# Patient Record
Sex: Female | Born: 1960 | Race: White | Hispanic: No | Marital: Married | Smoking: Never smoker
Health system: Southern US, Community
[De-identification: ages and names within clinical notes are randomized; demographics above are authoritative.]

## PROBLEM LIST (undated history)

## (undated) DIAGNOSIS — U071 COVID-19: Secondary | ICD-10-CM

## (undated) DIAGNOSIS — E559 Vitamin D deficiency, unspecified: Secondary | ICD-10-CM

## (undated) DIAGNOSIS — L309 Dermatitis, unspecified: Secondary | ICD-10-CM

## (undated) DIAGNOSIS — I671 Cerebral aneurysm, nonruptured: Secondary | ICD-10-CM

## (undated) DIAGNOSIS — H539 Unspecified visual disturbance: Secondary | ICD-10-CM

## (undated) DIAGNOSIS — E7800 Pure hypercholesterolemia, unspecified: Secondary | ICD-10-CM

## (undated) DIAGNOSIS — M5412 Radiculopathy, cervical region: Secondary | ICD-10-CM

## (undated) DIAGNOSIS — I2510 Atherosclerotic heart disease of native coronary artery without angina pectoris: Secondary | ICD-10-CM

## (undated) DIAGNOSIS — M62830 Muscle spasm of back: Secondary | ICD-10-CM

## (undated) DIAGNOSIS — Z8489 Family history of other specified conditions: Secondary | ICD-10-CM

## (undated) DIAGNOSIS — H9312 Tinnitus, left ear: Secondary | ICD-10-CM

## (undated) DIAGNOSIS — H4712 Papilledema associated with decreased ocular pressure: Secondary | ICD-10-CM

## (undated) DIAGNOSIS — M47812 Spondylosis without myelopathy or radiculopathy, cervical region: Secondary | ICD-10-CM

## (undated) DIAGNOSIS — D649 Anemia, unspecified: Secondary | ICD-10-CM

## (undated) DIAGNOSIS — M722 Plantar fascial fibromatosis: Secondary | ICD-10-CM

## (undated) DIAGNOSIS — R590 Localized enlarged lymph nodes: Secondary | ICD-10-CM

## (undated) DIAGNOSIS — F419 Anxiety disorder, unspecified: Secondary | ICD-10-CM

## (undated) DIAGNOSIS — H40059 Ocular hypertension, unspecified eye: Secondary | ICD-10-CM

## (undated) DIAGNOSIS — G529 Cranial nerve disorder, unspecified: Principal | ICD-10-CM

## (undated) DIAGNOSIS — D099 Carcinoma in situ, unspecified: Secondary | ICD-10-CM

## (undated) DIAGNOSIS — M8580 Other specified disorders of bone density and structure, unspecified site: Secondary | ICD-10-CM

## (undated) HISTORY — PX: BREAST BIOPSY: SHX20

## (undated) HISTORY — DX: Radiculopathy, cervical region: M54.12

## (undated) HISTORY — DX: Atherosclerotic heart disease of native coronary artery without angina pectoris: I25.10

## (undated) HISTORY — DX: Plantar fascial fibromatosis: M72.2

## (undated) HISTORY — DX: Localized enlarged lymph nodes: R59.0

## (undated) HISTORY — DX: Other specified disorders of bone density and structure, unspecified site: M85.80

## (undated) HISTORY — DX: Unspecified visual disturbance: H53.9

## (undated) HISTORY — DX: Cranial nerve disorder, unspecified: G52.9

## (undated) HISTORY — DX: Anxiety disorder, unspecified: F41.9

## (undated) HISTORY — DX: Cerebral aneurysm, nonruptured: I67.1

## (undated) HISTORY — DX: Pure hypercholesterolemia, unspecified: E78.00

## (undated) HISTORY — DX: Papilledema associated with decreased ocular pressure: H47.12

## (undated) HISTORY — DX: Vitamin D deficiency, unspecified: E55.9

## (undated) HISTORY — DX: Spondylosis without myelopathy or radiculopathy, cervical region: M47.812

## (undated) HISTORY — PX: BREAST SURGERY: SHX581

## (undated) HISTORY — DX: Tinnitus, left ear: H93.12

## (undated) HISTORY — DX: Muscle spasm of back: M62.830

## (undated) HISTORY — DX: COVID-19: U07.1

## (undated) HISTORY — DX: Carcinoma in situ, unspecified: D09.9

## (undated) HISTORY — DX: Ocular hypertension, unspecified eye: H40.059

---

## 1996-08-21 DIAGNOSIS — M62830 Muscle spasm of back: Secondary | ICD-10-CM

## 1996-08-21 HISTORY — DX: Muscle spasm of back: M62.830

## 2001-05-02 ENCOUNTER — Other Ambulatory Visit: Admission: RE | Admit: 2001-05-02 | Discharge: 2001-05-02 | Payer: Self-pay | Admitting: Gynecology

## 2002-03-14 ENCOUNTER — Encounter: Payer: Self-pay | Admitting: Gynecology

## 2002-03-14 ENCOUNTER — Encounter: Admission: RE | Admit: 2002-03-14 | Discharge: 2002-03-14 | Payer: Self-pay | Admitting: Gynecology

## 2002-05-05 ENCOUNTER — Other Ambulatory Visit: Admission: RE | Admit: 2002-05-05 | Discharge: 2002-05-05 | Payer: Self-pay | Admitting: Gynecology

## 2003-01-26 ENCOUNTER — Encounter: Payer: Self-pay | Admitting: Gynecology

## 2003-01-26 ENCOUNTER — Encounter: Admission: RE | Admit: 2003-01-26 | Discharge: 2003-01-26 | Payer: Self-pay | Admitting: Gynecology

## 2004-05-10 ENCOUNTER — Other Ambulatory Visit: Admission: RE | Admit: 2004-05-10 | Discharge: 2004-05-10 | Payer: Self-pay | Admitting: Gynecology

## 2004-07-25 ENCOUNTER — Encounter: Admission: RE | Admit: 2004-07-25 | Discharge: 2004-07-25 | Payer: Self-pay | Admitting: Radiology

## 2004-07-27 ENCOUNTER — Encounter: Admission: RE | Admit: 2004-07-27 | Discharge: 2004-07-27 | Payer: Self-pay | Admitting: Orthopedic Surgery

## 2004-10-13 ENCOUNTER — Encounter: Admission: RE | Admit: 2004-10-13 | Discharge: 2004-10-13 | Payer: Self-pay | Admitting: Internal Medicine

## 2004-10-26 ENCOUNTER — Encounter: Admission: RE | Admit: 2004-10-26 | Discharge: 2004-10-26 | Payer: Self-pay | Admitting: Orthopedic Surgery

## 2005-01-11 ENCOUNTER — Encounter: Admission: RE | Admit: 2005-01-11 | Discharge: 2005-01-11 | Payer: Self-pay | Admitting: Orthopedic Surgery

## 2005-06-08 ENCOUNTER — Other Ambulatory Visit: Admission: RE | Admit: 2005-06-08 | Discharge: 2005-06-08 | Payer: Self-pay | Admitting: Gynecology

## 2006-01-02 ENCOUNTER — Encounter: Admission: RE | Admit: 2006-01-02 | Discharge: 2006-01-02 | Payer: Self-pay | Admitting: Gynecology

## 2006-01-17 ENCOUNTER — Encounter: Admission: RE | Admit: 2006-01-17 | Discharge: 2006-01-17 | Payer: Self-pay | Admitting: Radiology

## 2006-06-26 ENCOUNTER — Other Ambulatory Visit: Admission: RE | Admit: 2006-06-26 | Discharge: 2006-06-26 | Payer: Self-pay | Admitting: Gynecology

## 2007-01-07 ENCOUNTER — Encounter: Admission: RE | Admit: 2007-01-07 | Discharge: 2007-01-07 | Payer: Self-pay | Admitting: Gynecology

## 2007-07-15 ENCOUNTER — Other Ambulatory Visit: Admission: RE | Admit: 2007-07-15 | Discharge: 2007-07-15 | Payer: Self-pay | Admitting: Gynecology

## 2008-01-08 ENCOUNTER — Encounter: Admission: RE | Admit: 2008-01-08 | Discharge: 2008-01-08 | Payer: Self-pay | Admitting: Gynecology

## 2009-01-08 ENCOUNTER — Encounter: Admission: RE | Admit: 2009-01-08 | Discharge: 2009-01-08 | Payer: Self-pay | Admitting: Gynecology

## 2010-01-25 ENCOUNTER — Encounter: Admission: RE | Admit: 2010-01-25 | Discharge: 2010-01-25 | Payer: Self-pay | Admitting: Gynecology

## 2010-04-17 ENCOUNTER — Ambulatory Visit (HOSPITAL_COMMUNITY): Admission: RE | Admit: 2010-04-17 | Discharge: 2010-04-17 | Payer: Self-pay | Admitting: Orthopedic Surgery

## 2010-04-21 HISTORY — PX: CERVICAL SPINE SURGERY: SHX589

## 2010-05-09 ENCOUNTER — Ambulatory Visit (HOSPITAL_COMMUNITY): Admission: RE | Admit: 2010-05-09 | Discharge: 2010-05-10 | Payer: Self-pay | Admitting: Neurosurgery

## 2010-11-03 LAB — CBC
HCT: 36.6 % (ref 36.0–46.0)
Hemoglobin: 12.8 g/dL (ref 12.0–15.0)
MCH: 31.8 pg (ref 26.0–34.0)
MCHC: 35 g/dL (ref 30.0–36.0)
MCV: 91 fL (ref 78.0–100.0)
Platelets: 331 10*3/uL (ref 150–400)
RBC: 4.02 MIL/uL (ref 3.87–5.11)
RDW: 12.2 % (ref 11.5–15.5)
WBC: 8.3 10*3/uL (ref 4.0–10.5)

## 2010-11-03 LAB — SURGICAL PCR SCREEN
MRSA, PCR: NEGATIVE
Staphylococcus aureus: POSITIVE — AB

## 2010-12-20 ENCOUNTER — Other Ambulatory Visit: Payer: Self-pay | Admitting: Gynecology

## 2010-12-20 DIAGNOSIS — Z1231 Encounter for screening mammogram for malignant neoplasm of breast: Secondary | ICD-10-CM

## 2011-01-27 ENCOUNTER — Ambulatory Visit
Admission: RE | Admit: 2011-01-27 | Discharge: 2011-01-27 | Disposition: A | Discharge status: Home / Self Care | Payer: 59 | Source: Ambulatory Visit | Attending: Gynecology | Admitting: Gynecology

## 2011-01-27 DIAGNOSIS — Z1231 Encounter for screening mammogram for malignant neoplasm of breast: Secondary | ICD-10-CM

## 2011-12-28 ENCOUNTER — Other Ambulatory Visit: Payer: Self-pay | Admitting: Gynecology

## 2011-12-28 DIAGNOSIS — M8580 Other specified disorders of bone density and structure, unspecified site: Secondary | ICD-10-CM

## 2011-12-28 DIAGNOSIS — Z1231 Encounter for screening mammogram for malignant neoplasm of breast: Secondary | ICD-10-CM

## 2012-02-06 ENCOUNTER — Ambulatory Visit
Admission: RE | Admit: 2012-02-06 | Discharge: 2012-02-06 | Disposition: A | Discharge status: Home / Self Care | Payer: 59 | Source: Ambulatory Visit | Attending: Gynecology | Admitting: Gynecology

## 2012-02-06 DIAGNOSIS — Z1231 Encounter for screening mammogram for malignant neoplasm of breast: Secondary | ICD-10-CM

## 2012-02-06 DIAGNOSIS — M8580 Other specified disorders of bone density and structure, unspecified site: Secondary | ICD-10-CM

## 2012-06-19 ENCOUNTER — Other Ambulatory Visit: Payer: Self-pay | Admitting: Neurosurgery

## 2012-06-19 DIAGNOSIS — M47812 Spondylosis without myelopathy or radiculopathy, cervical region: Secondary | ICD-10-CM

## 2012-06-19 DIAGNOSIS — M5030 Other cervical disc degeneration, unspecified cervical region: Secondary | ICD-10-CM

## 2012-06-19 DIAGNOSIS — M5020 Other cervical disc displacement, unspecified cervical region: Secondary | ICD-10-CM

## 2012-06-24 ENCOUNTER — Ambulatory Visit
Admission: RE | Admit: 2012-06-24 | Discharge: 2012-06-24 | Disposition: A | Discharge status: Home / Self Care | Payer: 59 | Source: Ambulatory Visit | Attending: Neurosurgery | Admitting: Neurosurgery

## 2012-06-24 DIAGNOSIS — M5030 Other cervical disc degeneration, unspecified cervical region: Secondary | ICD-10-CM

## 2012-06-24 DIAGNOSIS — M47812 Spondylosis without myelopathy or radiculopathy, cervical region: Secondary | ICD-10-CM

## 2012-06-24 DIAGNOSIS — M5020 Other cervical disc displacement, unspecified cervical region: Secondary | ICD-10-CM

## 2012-09-11 ENCOUNTER — Other Ambulatory Visit: Payer: Self-pay | Admitting: Radiology

## 2012-09-11 LAB — TSH: TSH: 1.624 u[IU]/mL (ref 0.350–4.500)

## 2012-09-12 ENCOUNTER — Ambulatory Visit
Admission: RE | Admit: 2012-09-12 | Discharge: 2012-09-12 | Disposition: A | Discharge status: Home / Self Care | Payer: 59 | Source: Ambulatory Visit | Attending: Ophthalmology | Admitting: Ophthalmology

## 2012-09-12 ENCOUNTER — Other Ambulatory Visit: Payer: Self-pay | Admitting: Ophthalmology

## 2012-09-12 DIAGNOSIS — H532 Diplopia: Secondary | ICD-10-CM

## 2012-09-12 DIAGNOSIS — H538 Other visual disturbances: Secondary | ICD-10-CM

## 2012-09-12 MED ORDER — GADOBENATE DIMEGLUMINE 529 MG/ML IV SOLN
14.0000 mL | Freq: Once | INTRAVENOUS | Status: AC | PRN
  Administered 2012-09-12: 14 mL via INTRAVENOUS

## 2012-09-21 HISTORY — PX: INTRACRANIAL ANEURYSM REPAIR: SHX1839

## 2012-10-15 ENCOUNTER — Other Ambulatory Visit: Payer: Self-pay | Admitting: *Deleted

## 2012-10-15 ENCOUNTER — Ambulatory Visit
Admission: RE | Admit: 2012-10-15 | Discharge: 2012-10-15 | Disposition: A | Discharge status: Home / Self Care | Payer: 59 | Source: Ambulatory Visit | Attending: *Deleted | Admitting: *Deleted

## 2012-10-15 ENCOUNTER — Other Ambulatory Visit: Payer: Self-pay | Admitting: Ophthalmology

## 2012-10-15 DIAGNOSIS — H538 Other visual disturbances: Secondary | ICD-10-CM

## 2012-10-15 DIAGNOSIS — R51 Headache: Secondary | ICD-10-CM

## 2012-11-24 ENCOUNTER — Ambulatory Visit (HOSPITAL_COMMUNITY)
Admission: RE | Admit: 2012-11-24 | Discharge: 2012-11-24 | Disposition: A | Discharge status: Home / Self Care | Payer: 59 | Source: Ambulatory Visit | Attending: Ophthalmology | Admitting: Ophthalmology

## 2012-11-24 ENCOUNTER — Other Ambulatory Visit (HOSPITAL_COMMUNITY): Payer: Self-pay | Admitting: Ophthalmology

## 2012-11-24 DIAGNOSIS — Z48812 Encounter for surgical aftercare following surgery on the circulatory system: Secondary | ICD-10-CM | POA: Insufficient documentation

## 2012-11-24 DIAGNOSIS — G453 Amaurosis fugax: Secondary | ICD-10-CM

## 2012-11-24 DIAGNOSIS — H3400 Transient retinal artery occlusion, unspecified eye: Secondary | ICD-10-CM | POA: Insufficient documentation

## 2012-11-24 DIAGNOSIS — H53129 Transient visual loss, unspecified eye: Secondary | ICD-10-CM | POA: Insufficient documentation

## 2012-11-24 DIAGNOSIS — I671 Cerebral aneurysm, nonruptured: Secondary | ICD-10-CM | POA: Insufficient documentation

## 2012-11-25 ENCOUNTER — Ambulatory Visit
Admission: RE | Admit: 2012-11-25 | Discharge: 2012-11-25 | Disposition: A | Discharge status: Home / Self Care | Payer: 59 | Source: Ambulatory Visit | Attending: Diagnostic Radiology | Admitting: Diagnostic Radiology

## 2012-11-25 ENCOUNTER — Other Ambulatory Visit (HOSPITAL_COMMUNITY): Payer: Self-pay | Admitting: Diagnostic Radiology

## 2012-11-25 ENCOUNTER — Other Ambulatory Visit: Payer: Self-pay | Admitting: Ophthalmology

## 2012-11-25 DIAGNOSIS — R519 Headache, unspecified: Secondary | ICD-10-CM

## 2012-11-28 ENCOUNTER — Other Ambulatory Visit (HOSPITAL_COMMUNITY): Payer: Self-pay | Admitting: *Deleted

## 2012-11-28 ENCOUNTER — Other Ambulatory Visit (HOSPITAL_COMMUNITY): Payer: Self-pay | Admitting: Ophthalmology

## 2012-11-28 DIAGNOSIS — G453 Amaurosis fugax: Secondary | ICD-10-CM

## 2012-11-29 ENCOUNTER — Other Ambulatory Visit (HOSPITAL_COMMUNITY): Payer: Self-pay | Admitting: Radiology

## 2012-11-29 DIAGNOSIS — G453 Amaurosis fugax: Secondary | ICD-10-CM

## 2012-12-17 ENCOUNTER — Ambulatory Visit
Admission: RE | Admit: 2012-12-17 | Discharge: 2012-12-17 | Disposition: A | Discharge status: Home / Self Care | Payer: 59 | Source: Ambulatory Visit | Attending: Ophthalmology | Admitting: Ophthalmology

## 2012-12-17 ENCOUNTER — Other Ambulatory Visit: Payer: Self-pay | Admitting: Ophthalmology

## 2012-12-17 DIAGNOSIS — J9859 Other diseases of mediastinum, not elsewhere classified: Secondary | ICD-10-CM

## 2012-12-31 ENCOUNTER — Other Ambulatory Visit: Payer: Self-pay

## 2012-12-31 DIAGNOSIS — Z1231 Encounter for screening mammogram for malignant neoplasm of breast: Secondary | ICD-10-CM

## 2013-02-06 ENCOUNTER — Ambulatory Visit
Admission: RE | Admit: 2013-02-06 | Discharge: 2013-02-06 | Disposition: A | Discharge status: Home / Self Care | Payer: 59 | Source: Ambulatory Visit

## 2013-02-06 DIAGNOSIS — Z1231 Encounter for screening mammogram for malignant neoplasm of breast: Secondary | ICD-10-CM

## 2013-03-05 ENCOUNTER — Telehealth: Payer: Self-pay | Admitting: *Deleted

## 2013-03-05 NOTE — Telephone Encounter (Signed)
Please review my thursday  Next week in AM - need a 11.00 o clock.

## 2013-03-05 NOTE — Telephone Encounter (Signed)
Dr. Kevan Ny called and asking about pt being seen sooner.  Wife of Dr. Antionette Poles.  Ocular neuromyotonia.  Appt 05-29-13.

## 2013-03-17 ENCOUNTER — Encounter: Payer: Self-pay | Admitting: Neurology

## 2013-03-18 ENCOUNTER — Ambulatory Visit (INDEPENDENT_AMBULATORY_CARE_PROVIDER_SITE_OTHER): Payer: 59 | Admitting: Neurology

## 2013-03-18 ENCOUNTER — Encounter: Payer: Self-pay | Admitting: Neurology

## 2013-03-18 VITALS — BP 163/100 | HR 100 | Resp 16 | Ht 66.0 in | Wt 158.0 lb

## 2013-03-18 DIAGNOSIS — G527 Disorders of multiple cranial nerves: Secondary | ICD-10-CM

## 2013-03-18 DIAGNOSIS — H4900 Third [oculomotor] nerve palsy, unspecified eye: Secondary | ICD-10-CM

## 2013-03-18 DIAGNOSIS — I671 Cerebral aneurysm, nonruptured: Secondary | ICD-10-CM | POA: Insufficient documentation

## 2013-03-18 DIAGNOSIS — G529 Cranial nerve disorder, unspecified: Secondary | ICD-10-CM

## 2013-03-18 DIAGNOSIS — R253 Fasciculation: Secondary | ICD-10-CM

## 2013-03-18 DIAGNOSIS — H4902 Third [oculomotor] nerve palsy, left eye: Secondary | ICD-10-CM

## 2013-03-18 DIAGNOSIS — H5702 Anisocoria: Secondary | ICD-10-CM

## 2013-03-18 DIAGNOSIS — R259 Unspecified abnormal involuntary movements: Secondary | ICD-10-CM

## 2013-03-18 DIAGNOSIS — H532 Diplopia: Secondary | ICD-10-CM | POA: Insufficient documentation

## 2013-03-18 MED ORDER — PYRIDOSTIGMINE BROMIDE 60 MG PO TABS: 60.0000 mg | ORAL_TABLET | Freq: Two times a day (BID) | ORAL | Status: DC

## 2013-03-18 NOTE — Progress Notes (Signed)
[facility]  Provider:  Dr Eyonna Sandstrom Referring Provider: Marden Noble, MD Primary Care Physician:  Pearla Dubonnet, MD  Chief Complaint  Patient presents with  . New Evaluation    [name], ocular neuromyotonia w/ superior oblique myokymia, paper referral, rm 10    HPI:  Melanie Aguirre is Aguirre 52 y.o. female here as Aguirre referral from Dr. Kevan Ny for Aguirre new look at Aguirre complicated patient.   Melanie Aguirre  menopausal ,right handed, married female , mother of 3, presents with Aguirre confusing History of oculomotor and pupillary changes to the left eye.  The patient reports that on 09/02/2012 shortly after she left her ophthalmologist's office she experienced for the first time diplopia this changes were worse when she looked to the right and up but resolved without further intervention this and probably two minutes or less. She sees Dr. Dagoberto Ligas for Aguirre mild elevated intraocular pressure and Aguirre thickened cornea.  Her spell remained not associated with tinnitus, numbness, dysesthesia, vertigo, nausea, balance problems or headaches. She has been in good health over all of the one episode of hypotension that did not require treatment at the time and has Aguirre history of cervical disc disease and anterior fusion in September 2011 by Dr Newell Coral. 3 days later following this time Aguirre dental procedure she noticed that her face became numb and she again experienced him minutes or less of vision blurriness another 3 days later she broke up with Aguirre very slight headache which is unusual for her. She noted some twitching of her right lower eyelid that day she had probably 5 or 6 episodes that she has characterized as vision blurriness from there on it happened every day with increasing frequency and made an hour or 250 times daily. She called her husband and she realized that the lion's of the bookshelves she was looking out the separating vertically that is when she realized that when she closed her left eye  she could see just well and if she) right her vision was abnormal. She also noticed the deviation of images was worse when looking up into the right. On January 20 as she was seen by Dr. Renella Cunas at here in [place] who did not find any neurologic focal deficits at the time. Dr. Dagoberto Ligas referred her to Dr. Daphine Deutscher at [facility] neuro ophthalmology department. Dr. Daphine Deutscher noticed Aguirre right hypertropia episodic and normalizing this in less than Aguirre minute. Visual to it it was unaffected she was seen again on 10-15-12 and during the exam had one of her spells but was not witnessed. Dr. 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 Differential diagnosis included myokymia of the above-named nerve, neuromyotonia or ocular myasthenia. There was no ptosis as is associated and no peripheral weakness MRI studies of the brain were normal ;she had Aguirre negative acetylcholine receptor antibody test. Aguirre single fiber EMG have to be aborted, as the patient had been using cosmetic Botox to her forehead. The spells also did not correlate with the degree of fatigue or fatigability. Dr. 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 For 12 hours she felt  none of her spells but then they came back. She continued to experience episodic double vision with interictal  intermittent normal vision. 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 In April she had Aguirre spell of loss of vision in the left eye only Aguirre friend of hers witnessed the spell  and described that her left eye tuned in and  Down, so far  that she would only see the white of the eye.  Doing Dr. Earl Gala evaluation in [place] the patient was described as having Aguirre slight pupillary asymmetry, which I am again appreciating today at baseline her right eye has Aguirre pupillary size for the suboptimal millimeter smaller than the left pupil she has Aguirre moderate tip was the him as Aguirre courier remains been exposed to light there is no ptosis the brief monocular vision loss seen Dr. be related to Aguirre TIA or carotid disease and studies of the carotid arteries were free of concerning occlusion or narrowing. It also appears that her left eye is slightly wider opened than the right but there is no en- or exophthalmos.  The patient has been increasingly anxious and nervous, understandable after experiencing sore far unexplained spells. She reports mild muscle spasms or fasciculations although her right arm and Aguirre DeBakey on she reports that she feels slowed in her movements. Somehow she has tingling mostly right arm right leg and she has now had brief episodes of vertigo. She feels that she has an increased level of anxiety and feels stressed. Her blood pressure and heart rate has accelerated. She has been placed on aspirin and Plavix after the monocular vision loss report assuming that it was in Aguirre morals as for the tach. This has caused her to have indigestion and bloating.  The patient's request is to evaluate her overall neurologic status again and to see if any systemic disease could have affected her vision and [place] paroxysmal diplopia. For her I could not find documentation that she has had any serological tests, her MRI is given nor evidence of strokes not even small vessel disease, there is no evidence of multiple sclerosis, bleed or trauma at. Her high heart rate today and her elevated blood pressure may now have been present for about 6 weeks while the patient has progressed through her   ordeal. The pupil is at baseline  slightly dilated and we have no evidence that  anisocoria was present before Jan  2014,  consider an involvement of the third nerve or sympathetic nerve fibers  still possible.         Review of Systems: Out of Aguirre complete 14 system review, the patient complains of only the following symptoms, and all other reviewed systems are negative.  various.   History   Social History  . Marital Status: Married    Spouse Name: N/Aguirre    Number of Children: 3  . Years of Education: BA3   Occupational History  . not employed    Social History Main Topics  . Smoking status: Never Smoker   . Smokeless tobacco: Not on file  . Alcohol Use: No  . Drug Use: No  . Sexually Active: Not on file   Other Topics Concern  . Not on file   Social History Narrative  . No narrative on file    Family History  Problem Relation Age of Onset  . Hypertension Father   . Congestive Heart  Failure Maternal Grandfather   . Cancer Paternal Grandmother     breast   . Cancer Paternal Grandfather     lung, throat    Past Medical History  Diagnosis Date  . Papilledema associated with decreased ocular pressure   . DJD (degenerative joint disease), cervical     history of fusion  . 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 Back spasm 1998    episodic severe low back, felt to have probably from lthe L5-S1 area. Usually responds to oxycodone one or two doses.  . Anxiety     last 4 months   . Intracerebral aneurysm     Past Surgical History  Procedure Laterality Date  . Cervical spine surgery  04/2010    Dr Lauralyn Primes  . Intracranial aneurysm repair  09/2012    coiled-Dr Sharen Heck    Current Outpatient Prescriptions  Medication Sig Dispense Refill  . aspirin 81 MG tablet Take 81 mg by mouth daily.      .  fish oil-omega-3 fatty acids 1000 MG capsule Take 2 g by mouth daily.      . magnesium oxide (MAG-OX) 400 MG tablet Take 400 mg by mouth daily.      . Multiple Vitamin (MULTIVITAMIN) tablet Take 1 tablet by mouth daily.      Marland Kitchen omeprazole (PRILOSEC) 40 MG capsule Take 40 mg by mouth daily. Delayed release,as needed      . VITAMIN D, CHOLECALCIFEROL, PO Take 2,000 Units by mouth daily.      Marland Kitchen ketorolac (TORADOL) 10 MG tablet       . omega-3 acid ethyl esters (LOVAZA) 1 G capsule        No current facility-administered medications for this visit.    Allergies as of 03/18/2013 - Review Complete 03/18/2013  Allergen Reaction Noted  . Erythromycin  03/17/2013    Vitals: BP 163/100  Pulse 100  Resp 16  Ht 5\' 6"  (1.676 m)  Wt 158 lb (71.668 kg)  BMI 25.51 kg/m2 Last Weight:  Wt Readings from Last 1 Encounters:  03/18/13 158 lb (71.668 kg)   Last Height:   Ht Readings from Last 1 Encounters:  03/18/13 5\' 6"  (1.676 m)   Vision Screening:    Physical exam:  General: The patient is awake, alert and appears not in acute distress. The patient is well groomed. Head: Normocephalic, atraumatic. Neck is supple.  Anterior fusion scar is well healed , on theleft . Mallampati3, neck circumference: 14 inches , no retrognathia . Cardiovascular:  Regular rate and rhythm- with 100 bpm , without  murmurs or carotid bruit, and without distended neck veins. BP elevated.  Respiratory: Lungs are clear to auscultation. Skin:  Without evidence of edema, or rash Trunk: BMI is mildly  elevated and patient  has normal posture.  Neurologic exam : The patient is awake and alert, oriented to place and time.  Memory subjective  described as intact. There is Aguirre normal attention span & concentration ability. Speech is fluent withoutdysarthria, dysphonia or aphasia. Mood and affect are appropriate, al little anxious.  Cranial nerves: Pupils are inequal , the left is 2 mm wider, the left eye  appears wider open   -Both Pupils are briskly reactive to light. Funduscopic exam without evidence of pallor or edema. Extraocular movements  in vertical and horizontal planes show Aguirre  slight bobbing in the left ,  when looking to the right and up.   Visual fields by finger perimetry are intact. The patient is able to maintain an upward gaze as well as Aguirre downward gaze without any sign of fatigability.   Doing our 1 hour meeting was able to witness four spells of  the left eye turning down and inwards  -  Resulting in diplopia with gaze to the right and up.  Hearing to finger rub intact.  Facial sensation intact to fine touch. Facial motor strength is symmetric and tongue and uvula move midline. There is no visible flushing, puffiness of the face, neck or jaw line. No reported numbness , and no pain.   Motor exam:   Normal tone, bilateral grip strength  and normal muscle bulk and symmetric normal strength in all extremities. There is no rigidity or cogwheeling  Noticed. Applying Aguirre gel to the right arm and directing Aguirre slight onto it did not reveal any visible fasciculations, neither were there any palpable fasciculations.  Sensory:  Fine touch, pinprick and vibration were tested in all extremities. Proprioception is tested in the upper extremities only. This was  normal.  Coordination: Rapid alternating movements in the fingers/hands is tested and normal. Finger-to-nose maneuver tested and normal without evidence of ataxia, dysmetria or tremor.  Gait and station: Patient walks without assistive device and is able to  climb up to the exam table. Strength within normal limits. The patient is able to rise from Aguirre seated position without bracing herself. It was evident that with the change in posture and other of her " eye  spells " begun. The patient had stable gait, and negative Romberg maneuver and postural stability. She was able to suppress the diplopia and I seen no risks for her to fall based on what I witnessed today.    Tandem gait is unfragmented. The patient can turn with 3 steps- Romberg testing is normal.  Deep tendon reflexes: in the  upper and lower extremities are symmetric and intact. Babinski maneuver response is  downgoing.   Assessment:  After physical and neurologic examination, review of laboratory studies, imaging, neurophysiology testing and pre-existing records, assessment will be reviewed on the problem list.  I appreciate the patient's trust in me but I am surely tapping in the dark.  I read that the patient was supposed to try carbamazepine to see as the paroxysmal spells had Aguirre seizure origin. I also noticed reviewing her an upward laboratory and blood work that she is at baseline hypernatremic. Her chloride was also low. I suggested to try Mestinon twice Aguirre day to be taken in the morning and around lunchtime and to correlate her observations with any possible defect. This is Aguirre medication trial but that expects or result in less than 4 days on Mestinon, but Aguirre results in response to Tegretol may take weeks. I have further ask one of my neuromuscular specialist colleagues to perform an EMG nerve conduction study, based upon the patient's report of fasciculations that her husband has witnessed.  I will add Aguirre basic serology panel for autoimmune processes to her blood work.  I have contacted Dr. Johnella Moloney office today to discuss the possibility of Aguirre 24-hour urine should her high blood pressure and high heart rate remained sustained. But consider anxiety as Aguirre possible cause I would also like to rule out elevated nor epinephrine levels  In the future.  I explained to the patient that her 3 MRI studies  this year have not shown any brain disease, and today's neurologic exam was normal except for the periodic eye movements spells. I do not suspect Aguirre degenerative, demyelinating or paraneoplastic disorder. She has brought in not to have strokes, tumors, bleeds.     Serology for Aguirre autoimmune and  inflammatory changes.   EMG /NCs for reported faciculation.  All records form outside.. imageing .

## 2013-03-18 NOTE — Patient Instructions (Addendum)
  24 hours urine collection  - evaluate for  Adrenergic output. Phaeo. Please discuss with Dr Kevan Ny. EMG and NCS are ordered  for fasciculation evaluation.   Take mestinon 60 mg  In AM and one at lunch and observe.   Driving restrictions do not apply.

## 2013-03-19 NOTE — Addendum Note (Signed)
Addended by: [name] on: 03/19/2013 11:03 AM   Modules accepted: Orders

## 2013-03-20 ENCOUNTER — Other Ambulatory Visit: Payer: Self-pay | Admitting: Neurology

## 2013-03-24 ENCOUNTER — Encounter (INDEPENDENT_AMBULATORY_CARE_PROVIDER_SITE_OTHER): Payer: 59

## 2013-03-24 ENCOUNTER — Ambulatory Visit (INDEPENDENT_AMBULATORY_CARE_PROVIDER_SITE_OTHER): Payer: 59 | Admitting: Diagnostic Neuroimaging

## 2013-03-24 DIAGNOSIS — H532 Diplopia: Secondary | ICD-10-CM

## 2013-03-24 DIAGNOSIS — R253 Fasciculation: Secondary | ICD-10-CM

## 2013-03-24 DIAGNOSIS — R259 Unspecified abnormal involuntary movements: Secondary | ICD-10-CM

## 2013-03-24 DIAGNOSIS — Z0289 Encounter for other administrative examinations: Secondary | ICD-10-CM

## 2013-03-24 DIAGNOSIS — H4902 Third [oculomotor] nerve palsy, left eye: Secondary | ICD-10-CM

## 2013-03-24 DIAGNOSIS — H5702 Anisocoria: Secondary | ICD-10-CM

## 2013-03-24 NOTE — Procedures (Signed)
   [facility]  NCS (NERVE CONDUCTION STUDY) WITH EMG (ELECTROMYOGRAPHY) REPORT   STUDY DATE: 03/24/13 PATIENT NAME: Melanie Aguirre DOB: 02-26-1961 MRN: [id number]  ORDERING CLINICIAN: [name], MD   TECHNOLOGIST: Gearldine Shown ELECTROMYOGRAPHER: Glenford Bayley. Draden Cottingham, MD  CLINICAL INFORMATION: 52 year old female with muscle twitching. Exam shows normal strength. No visible fasciculations in the tongue or extremities.  FINDINGS: NERVE CONDUCTION STUDY: Right median, right ulnar, right peroneal and right tibial motor responses have normal distal latencies, amplitudes, conduction velocities, and normal right median and right ulnar F wave latencies. Right median, right ulnar, right superficial peroneal sensory responses are normal.  Repetitive nerve stimulation of right median nerve demonstrates stable CMAP amplitudes at baseline, and following exertion at 15 seconds, 30 seconds, 1 minute, 2 minutes, 3 minutes and 4 minutes.  NEEDLE ELECTROMYOGRAPHY: Needle examination of selected muscles of the right upper extremity (deltoid, biceps, triceps, flexor carpi radialis, first dorsal interosseous) demonstrates rare complex repetitive discharges and positive sharp waves in the right biceps muscle at rest and normal motor unit recruitment on exertion and all muscles tested. Right C5-6 and C7-T1 paraspinal muscles are normal.  IMPRESSION:  Equivocal study demonstrating rare spontaneous activity in the right biceps muscle, may reflect underlying mild right cervical radiculopathy (C5, C6). Nerve conduction studies, electromyography of cervical paraspinal muscles and repetitive nerve stimulation studies are normal. No electrodiagnostic evidence to suggest an underlying neuromuscular junction disorder, neuropathy or myopathy at this time.   INTERPRETING PHYSICIAN:  Suanne Marker, MD Certified in Neurology, Neurophysiology and Neuroimaging  [facility] [address] [place], [place] [address] [phone]

## 2013-03-26 LAB — ANA W/REFLEX: Anti Nuclear Antibody(ANA): NEGATIVE

## 2013-03-26 LAB — IFE AND PE, SERUM
Albumin SerPl Elph-Mcnc: 4.2 g/dL (ref 3.2–5.6)
Alpha 1: 0.3 g/dL (ref 0.1–0.4)
Alpha2 Glob SerPl Elph-Mcnc: 0.6 g/dL (ref 0.4–1.2)
IgM (Immunoglobulin M), Srm: 104 mg/dL (ref 40–230)
Total Protein: 7.2 g/dL (ref 6.0–8.5)

## 2013-03-26 LAB — LYME, TOTAL AB TEST/REFLEX: Lyme Ab: <0.91 index (ref 0.00–0.90)

## 2013-03-26 LAB — SJOGREN'S SYNDROME ANTIBODS(SSA + SSB)
ENA SSA (RO) Ab: <0.2 AI (ref 0.0–0.9)
ENA SSB (LA) Ab: <0.2 AI (ref 0.0–0.9)

## 2013-04-04 ENCOUNTER — Telehealth: Payer: Self-pay | Admitting: Neurology

## 2013-04-08 NOTE — Telephone Encounter (Signed)
I called pt and LMVM for her to return call about appt, but also about 24 hour urine.

## 2013-04-11 ENCOUNTER — Telehealth: Payer: Self-pay | Admitting: Neurology

## 2013-04-14 NOTE — Telephone Encounter (Signed)
I LMVM for pt that if still needed to call me back.

## 2013-04-24 ENCOUNTER — Other Ambulatory Visit: Payer: Self-pay | Admitting: Neurology

## 2013-04-24 DIAGNOSIS — H4902 Third [oculomotor] nerve palsy, left eye: Secondary | ICD-10-CM

## 2013-04-24 DIAGNOSIS — H532 Diplopia: Secondary | ICD-10-CM

## 2013-04-24 DIAGNOSIS — G529 Cranial nerve disorder, unspecified: Secondary | ICD-10-CM

## 2013-04-25 NOTE — Telephone Encounter (Signed)
I spoke to patient, who just returned from being [place] of the [place], and relayed that Dr. Vickey Huger wanted her to have a catecholamine- 24hr urine test with her PCP.  She said Dr. Kevan Ny just needed an order to perform test.  The order for the test was faxed on 04-24-13, and the patient will call when testing will be done and then we can set up a follow up with Dr. Vickey Huger.

## 2013-04-29 ENCOUNTER — Ambulatory Visit: Payer: 59 | Admitting: Neurology

## 2013-04-30 NOTE — Telephone Encounter (Signed)
Spoke to patient 24hr urine test will be turned in on 05-05-13, follow up appointment with Dr. Vickey Huger on 05-09-13.

## 2013-05-09 ENCOUNTER — Ambulatory Visit (INDEPENDENT_AMBULATORY_CARE_PROVIDER_SITE_OTHER): Payer: 59 | Admitting: Neurology

## 2013-05-09 ENCOUNTER — Encounter: Payer: Self-pay | Admitting: Neurology

## 2013-05-09 ENCOUNTER — Telehealth: Payer: Self-pay | Admitting: Neurology

## 2013-05-09 VITALS — BP 142/90 | HR 71 | Ht 66.0 in | Wt 157.0 lb

## 2013-05-09 DIAGNOSIS — S0412XD Injury of oculomotor nerve, left side, subsequent encounter: Secondary | ICD-10-CM

## 2013-05-09 DIAGNOSIS — Z5189 Encounter for other specified aftercare: Secondary | ICD-10-CM

## 2013-05-09 DIAGNOSIS — G529 Cranial nerve disorder, unspecified: Secondary | ICD-10-CM

## 2013-05-09 DIAGNOSIS — H5702 Anisocoria: Secondary | ICD-10-CM

## 2013-05-09 DIAGNOSIS — H532 Diplopia: Secondary | ICD-10-CM

## 2013-05-09 DIAGNOSIS — H4900 Third [oculomotor] nerve palsy, unspecified eye: Secondary | ICD-10-CM

## 2013-05-09 DIAGNOSIS — R259 Unspecified abnormal involuntary movements: Secondary | ICD-10-CM

## 2013-05-09 MED ORDER — CARBAMAZEPINE ER 100 MG PO TB12: 100.0000 mg | ORAL_TABLET | Freq: Two times a day (BID) | ORAL | Status: DC

## 2013-05-09 NOTE — Patient Instructions (Signed)
Hyponatremia  Hyponatremia is when the salt (sodium) in your blood is low. When salt becomes low, your cells take in extra water and puff up (swell). The puffiness can happen in the whole body. It mostly affects the brain and is very serious.  HOME CARE  Only take medicine as told by your doctor.  Follow any diet instructions you were given. This includes limiting how much fluid you drink.  Keep all doctor visits for tests as told.  Avoid alcohol and drugs. GET HELP RIGHT AWAY IF:  You start to twitch and shake (seize).  You pass out (faint).  You continue to have watery poop (diarrhea) or you throw up (vomit).  You feel sick to your stomach (nauseous).  You are tired (fatigued), have a headache, are confused, or feel weak.  Your problems that first brought you to the doctor come back.  You have trouble following your diet instructions. MAKE SURE YOU:   Understand these instructions.  Will watch your condition.  Will get help right away if you are not doing well or get worse. Document Released: 04/19/2011 Document Revised: 10/30/2011 Document Reviewed: 04/19/2011 [employer] Patient Information 2014 [employer].  

## 2013-05-09 NOTE — Progress Notes (Signed)
Chief Complaint  Patient presents with  . 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  The patient reports that on 09/02/2012 shortly after she left her ophthalmologist's office she experienced for the first time diplopia this changes were worse when she looked to the right and up but resolved without further intervention this and probably two minutes or less. She sees Dr. Dagoberto Ligas for a mild elevated intraocular pressure and a thickened cornea.  Her spell remained not associated with tinnitus, numbness, dysesthesia, vertigo, nausea, balance problems or headaches. She has been in good health over all of the one episode of hypotension that did not require treatment at the time and has a history of cervical disc disease and anterior fusion in September 2011 by Dr Newell Coral. 3 days later following this time a dental procedure she noticed that her face became numb and she again experienced him minutes or less of vision blurriness another 3 days later she broke up with a very slight headache which is unusual for her. She noted some twitching of her right lower eyelid that day she had probably 5 or 6 episodes that she has characterized as vision blurriness from there on it happened every day with increasing frequency and made an hour or 250 times daily. She called her husband and she realized that the lion's of the bookshelves she was looking out the separating vertically that is when she realized that when she closed her left eye she could see just well and if she) right her vision was abnormal.  She also noticed the deviation of images was greater  when looking up to the right. On January 20 as she was seen by Dr. Renella Cunas at here in [place] who did not find any neurologic focal deficits at the time. Dr. Dagoberto Ligas referred her to Dr.  Daphine Deutscher at [facility] neuro ophthalmology department.  Dr. Daphine Deutscher noticed a right hypertropia episodic and normalizing this in less than a minute. Visual to it it was unaffected she was seen again on 10-15-12 and during the exam had one of her spells but was not witnessed. Dr. 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  Differential diagnosis included myochymia of the above-named nerve, neuromyotonia or ocular myasthenia.  There was no ptosis as is associated , and no peripheral weakness.  MRI studies of the brain were normal ;she had a negative acetylcholine receptor antibody test. A single fiber EMG had to be aborted, as the patient had been using cosmetic Botox to her forehead.  The spells also did not correlate with the degree of fatigue or fatigability.  Dr. 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  For 12 hours she felt none of her spells but then they came back. She continued to experience episodic double vision with interictal intermittent normal vision.  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   In April she had a spell of loss of vision in the left eye only a friend of hers witnessed the spell-  and described that her left eye tuned in and downward, so far that she would only see the white of the eye.  Doing the evaluation in [place] , the patient was described as having a slight pupillary asymmetry, which I am again appreciating today-  At baseline her right eye  has a pupillary size millimeter smaller than the left pupil.  She has a moderate  remains been exposed to light,  there is no ptosis. The brief monocular vision loss seen by my colleagues can not be related to a TIA or carotid disease- and studies of the carotid arteries were free of concerning occlusion or narrowing.  It also appears that her left eye is slightly wider opened than the right but there is no en- or exophthalmos.  The patient has been increasingly anxious and nervous, understandable after experiencing sore far unexplained spells. She reports mild muscle spasms or fasciculations although her right arm and a DeBakey on she reports that she feels slowed in her movements. Somehow she has tingling mostly right arm right leg and she has now had brief episodes of vertigo. She feels that she has an increased level of anxiety and feels stressed. Her blood pressure and heart rate has accelerated. She has been placed on aspirin and Plavix after the monocular vision loss report assuming that it was in a morals as for the tach. This has caused her to have indigestion and bloating.  The patient's request is to evaluate her overall neurologic status again and to see if any systemic disease could have affected her vision and Kossa paroxysmal diplopia. For her I could not find documentation that she has had any serological tests, her MRI is given nor evidence of strokes not even small vessel disease, there is no evidence of multiple sclerosis, bleed or trauma at. Her high heart rate today and her elevated blood pressure may now have been present for about 6 weeks while the patient has progressed through her ordeal.   The pupil is at baseline slightly dilated and we have no evidence that anisocoria was present before Jan 2014, consider an involvement of the third nerve or sympathetic nerve fibers still possible.  [place] , used malaria prophylaxis. No problems.     Dx unclear:   Review of last exam- unchanged. Natrium levels slightly lower- labs per  Dr Kevan Ny,  24 hour urine , no catecholamine increase.   Plan treat as repetitive motor abnormalities ,  with Tegretol.  Initiate 200 mg bid po.

## 2013-05-12 ENCOUNTER — Telehealth: Payer: Self-pay | Admitting: Neurology

## 2013-05-12 ENCOUNTER — Other Ambulatory Visit: Payer: 59

## 2013-05-13 ENCOUNTER — Ambulatory Visit: Payer: Self-pay | Admitting: Neurology

## 2013-05-20 ENCOUNTER — Encounter: Payer: Self-pay | Admitting: Neurology

## 2013-05-21 ENCOUNTER — Telehealth: Payer: Self-pay

## 2013-05-21 NOTE — Telephone Encounter (Signed)
Message copied by [facility] on Wed May 21, 2013  4:01 PM ------      Message from: Vickey Huger, CARMEN      Created: Wed May 21, 2013  1:26 PM        Labs  to be drawn when patient has been on tegretol for at least 3 weeks, needs RV with me - offer a lunchtime appointment , please.       ----- Message -----         From: SYSTEM         Sent: 05/14/2013  12:08 AM           To: [name], MD                   ------

## 2013-05-21 NOTE — Telephone Encounter (Signed)
I called patient and let her know, Dr. Vickey Huger wanted patient to f/u with lab draw after being on tegretol for at least 4 weeks. Please call to set that up for next week.

## 2013-05-29 ENCOUNTER — Ambulatory Visit: Payer: 59 | Admitting: Neurology

## 2013-05-29 NOTE — Telephone Encounter (Signed)
Patient scheduled  For upcoming appt.

## 2013-05-29 NOTE — Telephone Encounter (Signed)
done

## 2013-05-30 ENCOUNTER — Telehealth: Payer: Self-pay | Admitting: Neurology

## 2013-06-06 NOTE — Telephone Encounter (Signed)
Patient is starting her tegretol on 10/23 and has sched a f/u appt on 11/06 (14 day f/u),was so late starting because she could not get an appointment.

## 2013-06-10 NOTE — Telephone Encounter (Signed)
Called patient to reschedule appt, no answer,lt VM for return call to scheuled

## 2013-06-26 ENCOUNTER — Ambulatory Visit: Payer: Self-pay | Admitting: Neurology

## 2013-06-26 ENCOUNTER — Other Ambulatory Visit: Payer: Self-pay

## 2013-07-09 ENCOUNTER — Encounter: Payer: Self-pay | Admitting: Neurology

## 2013-07-09 ENCOUNTER — Ambulatory Visit (INDEPENDENT_AMBULATORY_CARE_PROVIDER_SITE_OTHER): Payer: 59 | Admitting: Neurology

## 2013-07-09 VITALS — BP 157/89 | HR 79 | Resp 17 | Ht 66.0 in | Wt 154.0 lb

## 2013-07-09 DIAGNOSIS — S0412XD Injury of oculomotor nerve, left side, subsequent encounter: Secondary | ICD-10-CM

## 2013-07-09 DIAGNOSIS — G529 Cranial nerve disorder, unspecified: Secondary | ICD-10-CM

## 2013-07-09 HISTORY — DX: Cranial nerve disorder, unspecified: G52.9

## 2013-07-09 MED ORDER — CARBAMAZEPINE ER 200 MG PO TB12: 200.0000 mg | ORAL_TABLET | Freq: Two times a day (BID) | ORAL | Status: DC

## 2013-07-09 NOTE — Progress Notes (Signed)
Chief Complaint  Patient presents with  . 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  The patient reports that on 09/02/2012 shortly after she left her ophthalmologist's office she experienced for the first time diplopia. These  changes were worse when she looked to the right and up,  but resolved without further intervention.  Lasting  probably two minutes or less.  She sees Dr. Dagoberto Ligas for a mild elevated intraocular pressure and a thickened cornea.  Her spell remained not associated with tinnitus, numbness, dysesthesia, vertigo, nausea, balance problems or headaches. She has been in good health over all of the one episode of hypotension that did not require treatment at the time and has a history of cervical disc disease and anterior fusion in September 2011 by Dr Newell Coral. 3 days later following this time a dental procedure , she noticed that her face became numb and she again experienced a minute  or less of vision blurriness.  Another 3 days later she woke  up with a very slight headache which is unusual for her.  She noted some twitching of her right lower eyelid that day she had probably 5 or 6 episodes that she has characterized as vision blurriness . From there on it happened every day with increasing frequency and up to 50 times daily.  She called her husband when she realized that  bookshelves she was looking at were visually  separating vertically-  that is when she realized that when she closed her left eye she could see just well and that her right her vision was abnormal.  She also noticed the deviation of images was greater when looking up to the right.  On January 20 as she was seen by Dr. Vela Prose  here in [place],  who did not find any neurologic focal deficits at the time.  Dr. Dagoberto Ligas referred her to Dr.  Daphine Deutscher at [facility]'s neuro -ophthalmology department.  Dr. Daphine Deutscher noticed a right hypertropia episodic ,  normalizing  in less than a minute.  10-15-12 ,during the exam had one of her spells which  was witnessed. Dr. 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  Differential diagnosis included myochymia of the above-named nerve, neuromyotonia or ocular myasthenia. There was no ptosis as is associated , and no peripheral weakness.  MRI studies of the brain were normal ;she had a negative acetylcholine receptor antibody test. A single fiber EMG had to be aborted, as the patient had been using cosmetic Botox to her forehead.  The spells also did not correlate with the degree of fatigue or fatigability.  Dr. 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  For 12 hours she felt none of her spells but then they came back. She continued to experience episodic double vision with interictal intermittent normal vision.  A followup brain MRI and MRA showed satisfactory appearance of the aneurysm, characterized as left internal carotid artery superior hypophyseal  located.   The patient has been increasingly anxious and nervous, understandable after experiencing sore far unexplained spells. She reports mild muscle spasms or fasciculations although her right arm and a DeBakey on she reports that she feels slowed in her movements. Somehow she has tingling mostly right arm right leg and she has now had brief episodes of vertigo. She feels that she has an increased level of anxiety and feels stressed. Her blood  pressure and heart rate has accelerated. She has been placed on aspirin and Plavix after the monocular vision loss report assuming that it was in a morals as for the tach. This has caused her to have indigestion and bloating.  The patient's request is to evaluate her overall neurologic status again and to see if any systemic disease could have affected her vision and Kossa paroxysmal diplopia. For her I could not find documentation that she has had any serological tests, her MRI is given nor evidence of strokes not even small vessel disease, there is no evidence of multiple sclerosis, bleed or trauma at. Her high heart rate today and her elevated blood pressure may now have been present for about 6 weeks while the patient has progressed through her ordeal.    Interval history : The patient underwent a 24-hour urine collection earlier this year which showed no abnormalities in the visual MI and metabolism.   I started the patient on this would reduce the frequencies offer him abnormal eye movements. She said that about 14 days ago she actually started to take the carbamazepine 200 mg twice a day with the hope that medication. She has noticed possibly a decrease in spell frequency but he has also not participated in all the activities that seem to trigger the eye movement abnormalities in the past.  Since Dr. Kevan Ny saw her just yesterday and drew additional labs I will add stools for my lab panel today I do not think that he have any additional need for any blood to be drawn here today. Sed rate , CMZ level,  TSH all normal.      Dx unclear:  Review of last exam- unchanged. 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  Something is related to orthostasis and BP .   PLAN:   with Tegretol.  Initiate 200 mg tid po, 200 in AM and 400 Mg in PM.

## 2013-07-09 NOTE — Patient Instructions (Signed)
Carbamazepine extended-release capsules What is this medicine? CARBAMAZEPINE (kar ba MAZ e peen) is used to control seizures caused by certain types of epilepsy. This medicine is also used to treat nerve related pain. It is not for common aches and pains. This medicine may be used for other purposes; ask your health care provider or pharmacist if you have questions. COMMON BRAND NAME(S): Carbatrol What should I tell my health care provider before I take this medicine? They need to know if you have any of these conditions: -Asian ancestry -bone marrow disease -glaucoma -heart disease or irregular heartbeat -kidney disease -liver disease -low blood counts, like low white cell, platelet, or red cell counts -porphyria -psychotic disorders -suicidal thoughts, plans, or attempt; a previous suicide attempt by you or a family member -an unusual or allergic reaction to carbamazepine, tricyclic antidepressants, phenytoin, phenobarbital or other medicines, foods, dyes, or preservatives -pregnant or trying to get pregnant -breast-feeding How should I use this medicine? Take this medicine by mouth with a glass of water. Follow the directions on the prescription label. Do not cut, crush or chew this medicine. Take this medicine with or without food. The capsules can be opened and the beads sprinkled over food such as applesauce or other similar food product. Take your doses at regular intervals. Do not take your medicine more often than directed. Do not stop taking except on your doctor's advice. A special MedGuide will be given to you by the pharmacist with each prescription and refill. Be sure to read this information carefully each time. Talk to your pediatrician regarding the use of this medicine in children. While this drug may be prescribed for selected conditions, precautions do apply. Overdosage: If you think you have taken too much of this medicine contact a poison control center or emergency room  at once. NOTE: This medicine is only for you. Do not share this medicine with others. What if I miss a dose? If you miss a dose, take it as soon as you can. If it is almost time for your next dose, take only that dose. Do not take double or extra doses. What may interact with this medicine? Do not take this medicine with any of the following medications: -delavirdine -MAOIs like Carbex, Eldepryl, Marplan, Nardil, and Parnate -nefazodone -oxcarbazepine This medicine may also interact with the following medications: -acetaminophen -acetazolamide -barbiturate medicines for inducing sleep or treating seizures, like phenobarbital -certain antibiotics like clarithromycin, erythromycin or troleandomycin -cimetidine -cyclosporine -danazol -dicumarol -doxycycline -female hormones, including estrogens and birth control pills -grapefruit juice -isoniazid, INH -levothyroxine and other thyroid hormones -lithium and other medicines to treat mood problems or psychotic disturbances -loratadine -medicines for angina or high blood pressure -medicines for cancer -medicines for depression or anxiety -medicines for sleep -medicines to treat fungal infections, like fluconazole, itraconazole or ketoconazole -medicines used to treat HIV infection or AIDS -methadone -niacinamide -praziquantel -propoxyphene -rifampin or rifabutin -seizure or epilepsy medicine -steroid medicines such as prednisone or cortisone -theophylline -tramadol -warfarin This list may not describe all possible interactions. Give your health care provider a list of all the medicines, herbs, non-prescription drugs, or dietary supplements you use. Also tell them if you smoke, drink alcohol, or use illegal drugs. Some items may interact with your medicine. What should I watch for while using this medicine? Visit your doctor or health care professional for a regular check on your progress. Do not change brands or dosage forms of  this medicine without discussing the change with your doctor or health   care professional. If you are taking this medicine for epilepsy (seizures) do not stop taking it suddenly. This increases the risk of seizures. Wear a Arboriculturist or necklace. Carry an identification card with information about your condition, medications, and doctor or health care professional. Bonita Quin may get drowsy, dizzy, or have blurred vision. Do not drive, use machinery, or do anything that needs mental alertness until you know how this medicine affects you. To reduce dizzy or fainting spells, do not sit or stand up quickly, especially if you are an older patient. Alcohol can increase drowsiness and dizziness. Avoid alcoholic drinks. Birth control pills may not work properly while you are taking this medicine. Talk to your doctor about using an extra method of birth control. This medicine can make you more sensitive to the sun. Keep out of the sun. If you cannot avoid being in the sun, wear protective clothing and use sunscreen. Do not use sun lamps or tanning beds/booths. The use of this medicine may increase the chance of suicidal thoughts or actions. Pay special attention to how you are responding while on this medicine. Any worsening of mood, or thoughts of suicide or dying should be reported to your health care professional right away. Women who become pregnant while using this medicine may enroll in the [place] American Antiepileptic Drug Pregnancy Registry by calling [phone]. This registry collects information about the safety of antiepileptic drug use during pregnancy. What side effects may I notice from receiving this medicine? Side effects that you should report to your doctor or health care professional as soon as possible: -allergic reactions like skin rash, itching or hives, swelling of the face, lips, or tongue -breathing problems -changes in vision -confusion -dark urine -fast or irregular  heartbeat -fever or chills, sore throat -mouth ulcers -pain or difficulty passing urine -redness, blistering, peeling or loosening of the skin, including inside the mouth -ringing in the ears -seizures -stomach pain -swollen joints or muscle/joint aches and pains -unusual bleeding or bruising -unusually weak or tired -vomiting -worsening of mood, thoughts or actions of suicide or dying -yellowing of the eyes or skin Side effects that usually do not require medical attention (report to your doctor or health care professional if they continue or are bothersome): -clumsiness or unsteadiness -diarrhea or constipation -headache -increased sweating -nausea This list may not describe all possible side effects. Call your doctor for medical advice about side effects. You may report side effects to FDA at 1-800-FDA-1088. Where should I keep my medicine? Keep out of reach of children. Store at room temperature between 15 and 30 degrees C (59 and 86 degrees F). Keep container tightly closed. Protect from moisture. Throw away any unused medicine after the expiration date. NOTE: This sheet is a summary. It may not cover all possible information. If you have questions about this medicine, talk to your doctor, pharmacist, or health care provider.  2014, Elsevier/Gold Standard. (2012-10-15 15:27:59)

## 2013-07-11 ENCOUNTER — Other Ambulatory Visit: Payer: Self-pay | Admitting: Neurology

## 2013-07-11 ENCOUNTER — Telehealth: Payer: Self-pay | Admitting: Neurology

## 2013-07-11 NOTE — Telephone Encounter (Signed)
I received the labs form dr  Valentina Lucks / [facility]. Natrium 130 , already reduced.  No increase in dose of carbamazepine advised.  i gave this lab report to Lupita Leash this morning to call the patient. She will stop carbamazepine. i called her.

## 2013-07-11 NOTE — Telephone Encounter (Signed)
Please advise 

## 2013-07-24 ENCOUNTER — Other Ambulatory Visit (INDEPENDENT_AMBULATORY_CARE_PROVIDER_SITE_OTHER): Payer: Self-pay

## 2013-07-24 DIAGNOSIS — Z0289 Encounter for other administrative examinations: Secondary | ICD-10-CM

## 2013-07-24 LAB — COMPREHENSIVE METABOLIC PANEL
ALT: 34 IU/L — ABNORMAL HIGH (ref 0–32)
AST: 26 IU/L (ref 0–40)
Albumin/Globulin Ratio: 1.4 (ref 1.1–2.5)
Albumin: 4.4 g/dL (ref 3.5–5.5)
Alkaline Phosphatase: 71 IU/L (ref 39–117)
BUN/Creatinine Ratio: 21 (ref 9–23)
BUN: 15 mg/dL (ref 6–24)
CO2: 29 mmol/L (ref 18–29)
Calcium: 9.9 mg/dL (ref 8.7–10.2)
Chloride: 99 mmol/L (ref 96–108)
Creatinine, Ser: 0.72 mg/dL (ref 0.57–1.00)
GFR calc Af Amer: 110 mL/min/{1.73_m2} (ref >59)
GFR calc non Af Amer: 95 mL/min/{1.73_m2} (ref >59)
Globulin, Total: 3.2 g/dL (ref 1.5–4.5)
Glucose: 89 mg/dL (ref 65–99)
Potassium: 4.1 mmol/L (ref 3.5–5.2)
Sodium: 137 mmol/L (ref 134–144)
Total Bilirubin: 0.3 mg/dL (ref 0.0–1.2)
Total Protein: 7.6 g/dL (ref 6.0–8.5)

## 2013-07-24 LAB — CBC
HCT: 37.6 % (ref 34.0–46.6)
Hemoglobin: 13.3 g/dL (ref 11.1–15.9)
MCH: 32 pg (ref 26.6–33.0)
MCHC: 35.4 g/dL (ref 31.5–35.7)
MCV: 91 fL (ref 79–97)
Platelets: 312 10*3/uL (ref 150–379)
RBC: 4.15 x10E6/uL (ref 3.77–5.28)
RDW: 12.6 % (ref 12.3–15.4)
WBC: 5.9 10*3/uL (ref 3.4–10.8)

## 2013-08-05 ENCOUNTER — Encounter: Payer: Self-pay | Admitting: Neurology

## 2013-08-05 ENCOUNTER — Ambulatory Visit (INDEPENDENT_AMBULATORY_CARE_PROVIDER_SITE_OTHER): Payer: 59 | Admitting: Neurology

## 2013-08-05 VITALS — BP 154/90 | HR 90 | Resp 16 | Ht 67.0 in | Wt 156.0 lb

## 2013-08-05 DIAGNOSIS — H532 Diplopia: Secondary | ICD-10-CM

## 2013-08-05 DIAGNOSIS — H5702 Anisocoria: Secondary | ICD-10-CM

## 2013-08-05 NOTE — Progress Notes (Signed)
Chief Complaint  Patient presents with  . Follow-up    room 11  . cranial nerve palsy  . oculomotor nerve injury   Interval history- and exam .   The patient has noted more frequent this normally a poorly regulated BP, her spells have continued they involve still pupillary dilation and an occipital motor or abnormality. Except for the diplopia her vision is not changing during the spells. He affected eye is the left pulmonary she did pretty well on her visual testing here missed only one line from 20/20 on the left eye.  Had repeated angiography last July this good report and is awaiting a follow up MRI in some of the next year.  The pain movements are associated visit pupillary dilation in the left eye ended with an up and in phenomena - diplopia is worse at distance, she is fairly unaffected when reading. When looking up and to the right causes the most interference.   corrective steps are for the patient to look down and out which limits the diplopia interference rather than medial and up vision.   Spells occur up to 50 times a day. She has spells of sudden high blood pressure.   The dilation of the pupil,and the anisocoria at baseline are speaking for an autonomic nerve  injury , likely location along the retinal artery or artery ganglion.  Direct light causes constriction as is consensual constriction, but the left pupil relaxes and dilates with both maneuvers while the right stays constricted.  That is a possible fatigue response?  There is normal EOM,  No ptosis today- Smooth per suit and normal  saccades.  She notices the eye deviation with movement- with her early AM trip to the bathroom.  Tegretol was discontinued due to hyponatremia.      Last visit note -Mrs, Winward , a menopausal ,right handed, married female , mother of 3, presents with a confusing History of oculomotor and pupillary changes to the left eye.  The patient reports that on 09/02/2012 shortly after she left her  ophthalmologist's office she experienced for the first time diplopia. These  changes were worse when she looked to the right and up,  but resolved without further intervention.  Lasting  probably two minutes or less.  She sees Dr. Dagoberto Ligas for a mild elevated intraocular pressure and a thickened cornea.  Her spell remained not associated with tinnitus, numbness, dysesthesia, vertigo, nausea, balance problems or headaches. She has been in good health over all of the one episode of hypotension that did not require treatment at the time and has a history of cervical disc disease and anterior fusion in September 2011 by Dr Newell Coral. 3 days later following this time a dental procedure , she noticed that her face became numb and she again experienced a minute  or less of vision blurriness.  Another 3 days later she woke  up with a very slight headache which is unusual for her.  She noted some twitching of her right lower eyelid that day she had probably 5 or 6 episodes that she has characterized as vision blurriness . From there on it happened every day with increasing frequency and up to 50 times daily.  She called her husband when she realized that  bookshelves she was looking at were visually  separating vertically-  that is when she realized that when she closed her left eye she could see just well and that her right her vision was abnormal.  She also noticed the  deviation of images was greater when looking up to the right.  On January 20 as she was seen by Dr. Vela Prose  here in [place],  who did not find any neurologic focal deficits at the time.  Dr. Dagoberto Ligas referred her to Dr. Daphine Deutscher at [facility].  Dr. Daphine Deutscher noticed a right hypertropia episodic ,  normalizing  in less than a minute.  10-15-12 ,during the exam had one of her spells which  was witnessed. Dr. 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  Differential diagnosis included myochymia of the above-named nerve, neuromyotonia or ocular myasthenia. There was no ptosis as is associated , and no peripheral weakness.  MRI studies of the brain were normal ;she had a negative acetylcholine receptor antibody test. A single fiber EMG had to be aborted, as the patient had been using cosmetic Botox to her forehead.  The spells also did not correlate with the degree of fatigue or fatigability.  Dr. 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  For 12 hours she felt none of her spells but then they came back. She continued to experience episodic double vision with interictal intermittent normal vision.  A followup brain MRI and MRA showed satisfactory appearance of the aneurysm, characterized as  Located at the left internal carotid artery superior to the hypophysis .   The patient has been increasingly anxious and nervous, understandable after experiencing sore far unexplained spells. She reports mild muscle spasms or fasciculations although her right arm and a DeBakey on she reports that she feels slowed in her movements. Somehow she has tingling mostly right arm right leg and she has now had brief episodes of vertigo. She feels that she has an increased level of anxiety and feels stressed. Her blood pressure and heart rate has accelerated. She has been placed on aspirin and Plavix after the monocular vision loss report assuming that it was in a morals as for the tach. This has caused her to have indigestion and bloating.  The patient's request is to evaluate her overall neurologic status again and to see if any  systemic disease could have affected her vision and Kossa paroxysmal diplopia. For her I could not find documentation that she has had any serological tests, her MRI is given nor evidence of strokes not even small vessel disease, there is no evidence of multiple sclerosis, bleed or trauma at. Her high heart rate today and her elevated blood pressure may now have been present for about 6 weeks while the patient has progressed through her ordeal.  The patient underwent a 24-hour urine collection earlier this year which showed no abnormalities in the visual MI and metabolism.  I started the patient on this would reduce the frequencies offer him abnormal eye movements. She said that about 14 days ago she actually started to take the carbamazepine 200 mg twice a day with the hope that medication. She has noticed possibly a decrease in spell frequency but he has also not participated in all the activities that seem to trigger the eye movement abnormalities in the past.  Since Dr. Kevan Ny saw her just yesterday and drew additional labs I will add stools for my lab panel today I do not think that he have any additional need for any blood to be drawn here today. Sed rate , CMZ level,  TSH all normal.     Assessment : efferent eye disorder.   Plan : MRI with highest TESLA  Available.  Midbrain and ophtalmic  / oculomotor pathway to be visualized .  with and without gadolinium . BP variation remains unexplained.

## 2013-08-06 ENCOUNTER — Telehealth: Payer: Self-pay | Admitting: *Deleted

## 2013-08-06 ENCOUNTER — Telehealth: Payer: Self-pay | Admitting: Neurology

## 2013-08-06 NOTE — Telephone Encounter (Signed)
We just had a visit yesterday ,

## 2013-08-06 NOTE — Telephone Encounter (Signed)
Patient's husband returning your call. Please call him back.

## 2013-08-06 NOTE — Telephone Encounter (Signed)
Called to get more information, lt VM message

## 2013-08-06 NOTE — Telephone Encounter (Signed)
Called patient , no answer, left VM message for call back with reason for call

## 2013-08-06 NOTE — Telephone Encounter (Signed)
Patient's husband Dr. Barrington Ellison calling about his wife. Please call.

## 2013-08-07 ENCOUNTER — Other Ambulatory Visit: Payer: Self-pay | Admitting: Neurology

## 2013-08-07 ENCOUNTER — Telehealth: Payer: Self-pay | Admitting: Neurology

## 2013-08-07 DIAGNOSIS — H4902 Third [oculomotor] nerve palsy, left eye: Secondary | ICD-10-CM

## 2013-08-07 DIAGNOSIS — H5702 Anisocoria: Secondary | ICD-10-CM

## 2013-08-07 NOTE — Telephone Encounter (Signed)
done

## 2013-08-08 ENCOUNTER — Ambulatory Visit
Admission: RE | Admit: 2013-08-08 | Discharge: 2013-08-08 | Disposition: A | Discharge status: Home / Self Care | Payer: 59 | Source: Ambulatory Visit | Attending: Neurology | Admitting: Neurology

## 2013-08-08 DIAGNOSIS — H5702 Anisocoria: Secondary | ICD-10-CM

## 2013-08-08 DIAGNOSIS — H532 Diplopia: Secondary | ICD-10-CM

## 2013-08-08 DIAGNOSIS — H4902 Third [oculomotor] nerve palsy, left eye: Secondary | ICD-10-CM

## 2013-08-08 MED ORDER — GADOBENATE DIMEGLUMINE 529 MG/ML IV SOLN
14.0000 mL | Freq: Once | INTRAVENOUS | Status: AC | PRN
  Administered 2013-08-08: 14 mL via INTRAVENOUS

## 2013-08-12 NOTE — Progress Notes (Signed)
Quick Note:  Left message that carotid arteries were fine and no bulb injury, per Dr. Vickey Huger. ______

## 2013-08-12 NOTE — Progress Notes (Signed)
Quick Note:  Called per Dr.Dohmeier. Told patient to call if any questions. ______

## 2013-08-29 ENCOUNTER — Ambulatory Visit
Admission: RE | Admit: 2013-08-29 | Discharge: 2013-08-29 | Disposition: A | Discharge status: Home / Self Care | Payer: 59 | Source: Ambulatory Visit | Attending: Neurosurgery | Admitting: Neurosurgery

## 2013-08-29 ENCOUNTER — Other Ambulatory Visit: Payer: Self-pay | Admitting: Neurosurgery

## 2013-08-29 DIAGNOSIS — M5412 Radiculopathy, cervical region: Secondary | ICD-10-CM

## 2013-08-30 ENCOUNTER — Other Ambulatory Visit (HOSPITAL_COMMUNITY): Payer: Self-pay | Admitting: Neurosurgery

## 2013-08-30 ENCOUNTER — Ambulatory Visit (HOSPITAL_COMMUNITY)
Admission: RE | Admit: 2013-08-30 | Discharge: 2013-08-30 | Disposition: A | Discharge status: Home / Self Care | Payer: 59 | Source: Ambulatory Visit | Attending: Neurosurgery | Admitting: Neurosurgery

## 2013-08-30 ENCOUNTER — Ambulatory Visit (HOSPITAL_COMMUNITY)
Admission: EM | Admit: 2013-08-30 | Discharge: 2013-08-30 | Disposition: A | Discharge status: Home / Self Care | Payer: 59 | Source: Ambulatory Visit | Attending: Neurosurgery | Admitting: Neurosurgery

## 2013-08-30 DIAGNOSIS — M5090 Cervical disc disorder, unspecified, unspecified cervical region: Secondary | ICD-10-CM

## 2013-08-30 DIAGNOSIS — M5020 Other cervical disc displacement, unspecified cervical region: Secondary | ICD-10-CM | POA: Insufficient documentation

## 2013-09-01 ENCOUNTER — Encounter (HOSPITAL_COMMUNITY): Payer: Self-pay | Admitting: Respiratory Therapy

## 2013-09-02 ENCOUNTER — Other Ambulatory Visit: Payer: Self-pay | Admitting: Neurosurgery

## 2013-09-03 ENCOUNTER — Encounter (HOSPITAL_COMMUNITY): Payer: Self-pay

## 2013-09-03 ENCOUNTER — Other Ambulatory Visit (HOSPITAL_COMMUNITY): Payer: Self-pay | Admitting: *Deleted

## 2013-09-03 ENCOUNTER — Encounter (HOSPITAL_COMMUNITY)
Admission: RE | Admit: 2013-09-03 | Discharge: 2013-09-03 | Disposition: A | Discharge status: Home / Self Care | Payer: 59 | Source: Ambulatory Visit | Attending: Neurosurgery | Admitting: Neurosurgery

## 2013-09-03 HISTORY — DX: Anemia, unspecified: D64.9

## 2013-09-03 HISTORY — DX: Dermatitis, unspecified: L30.9

## 2013-09-03 HISTORY — DX: Family history of other specified conditions: Z84.89

## 2013-09-03 LAB — CBC
HCT: 40.2 % (ref 36.0–46.0)
Hemoglobin: 13.8 g/dL (ref 12.0–15.0)
MCH: 32.2 pg (ref 26.0–34.0)
MCHC: 34.3 g/dL (ref 30.0–36.0)
MCV: 93.9 fL (ref 78.0–100.0)
PLATELETS: 318 10*3/uL (ref 150–400)
RBC: 4.28 MIL/uL (ref 3.87–5.11)
RDW: 13.1 % (ref 11.5–15.5)
WBC: 5.8 10*3/uL (ref 4.0–10.5)

## 2013-09-03 LAB — SURGICAL PCR SCREEN
MRSA, PCR: NEGATIVE
STAPHYLOCOCCUS AUREUS: POSITIVE — AB

## 2013-09-03 LAB — BASIC METABOLIC PANEL
BUN: 20 mg/dL (ref 6–23)
CHLORIDE: 98 meq/L (ref 96–112)
CO2: 27 meq/L (ref 19–32)
Calcium: 9.9 mg/dL (ref 8.4–10.5)
Creatinine, Ser: 0.71 mg/dL (ref 0.50–1.10)
GFR calc non Af Amer: >90 mL/min (ref >90)
Glucose, Bld: 97 mg/dL (ref 70–99)
Potassium: 4.6 mEq/L (ref 3.7–5.3)
Sodium: 138 mEq/L (ref 137–147)

## 2013-09-03 NOTE — Progress Notes (Signed)
Called Dr. Earl GalaNudelman's office and spoke with Darl PikesSusan. Requested orders in EPIC to be signed. Pt coming for PAT appt at 10 am today.

## 2013-09-03 NOTE — Pre-Procedure Instructions (Signed)
Corliss BlackerKimberly M Dehner  09/03/2013   Your procedure is scheduled on:  Thursday, September 04, 2013 at 7:30 AM.   [facility] "[facility]" Admitting Office at 5:30 AM.   Call this number if you have problems the morning of surgery: [phone]   Remember:   Do not eat food or drink liquids after midnight tonight, 09/03/13.   Take these medicines the morning of surgery with A SIP OF WATER: omeprazole (PRILOSEC) - if needed.      Do not wear jewelry, make-up or nail polish.  Do not wear lotions, powders, or perfumes. You may wear deodorant.  Do not shave 48 hours prior to surgery.   Do not bring valuables to the hospital.  [facility] is not responsible                  for any belongings or valuables.               Contacts, dentures or bridgework may not be worn into surgery.  Leave suitcase in the car. After surgery it may be brought to your room.  For patients admitted to the hospital, discharge time is determined by your                treatment team.                Special Instructions: Shower using CHG 2 nights before surgery and the night before surgery.  If you shower the day of surgery use CHG.  Use special wash - you have one bottle of CHG for all showers.  You should use approximately 1/3 of the bottle for each shower.   Please read over the following fact sheets that you were given: Pain Booklet, Coughing and Deep Breathing, MRSA Information and Surgical Site Infection Prevention

## 2013-09-04 ENCOUNTER — Encounter (HOSPITAL_COMMUNITY): Payer: Self-pay | Admitting: *Deleted

## 2013-09-04 ENCOUNTER — Ambulatory Visit (HOSPITAL_COMMUNITY): Payer: 59

## 2013-09-04 ENCOUNTER — Ambulatory Visit (HOSPITAL_COMMUNITY): Payer: 59 | Admitting: Anesthesiology

## 2013-09-04 ENCOUNTER — Observation Stay (HOSPITAL_COMMUNITY)
Admission: RE | Admit: 2013-09-04 | Discharge: 2013-09-05 | Disposition: A | Discharge status: Home / Self Care | Payer: 59 | Source: Ambulatory Visit | Attending: Neurosurgery | Admitting: Neurosurgery

## 2013-09-04 ENCOUNTER — Encounter (HOSPITAL_COMMUNITY): Payer: 59 | Admitting: Anesthesiology

## 2013-09-04 ENCOUNTER — Encounter (HOSPITAL_COMMUNITY)
Admission: RE | Disposition: A | Discharge status: Home / Self Care | Payer: Self-pay | Source: Ambulatory Visit | Attending: Neurosurgery

## 2013-09-04 DIAGNOSIS — M5020 Other cervical disc displacement, unspecified cervical region: Principal | ICD-10-CM | POA: Diagnosis present

## 2013-09-04 DIAGNOSIS — Z472 Encounter for removal of internal fixation device: Secondary | ICD-10-CM | POA: Insufficient documentation

## 2013-09-04 DIAGNOSIS — F411 Generalized anxiety disorder: Secondary | ICD-10-CM | POA: Insufficient documentation

## 2013-09-04 DIAGNOSIS — Z981 Arthrodesis status: Secondary | ICD-10-CM | POA: Insufficient documentation

## 2013-09-04 DIAGNOSIS — L259 Unspecified contact dermatitis, unspecified cause: Secondary | ICD-10-CM | POA: Insufficient documentation

## 2013-09-04 DIAGNOSIS — Z01812 Encounter for preprocedural laboratory examination: Secondary | ICD-10-CM | POA: Insufficient documentation

## 2013-09-04 DIAGNOSIS — I739 Peripheral vascular disease, unspecified: Secondary | ICD-10-CM | POA: Insufficient documentation

## 2013-09-04 DIAGNOSIS — M5030 Other cervical disc degeneration, unspecified cervical region: Secondary | ICD-10-CM | POA: Insufficient documentation

## 2013-09-04 DIAGNOSIS — M47812 Spondylosis without myelopathy or radiculopathy, cervical region: Secondary | ICD-10-CM | POA: Insufficient documentation

## 2013-09-04 HISTORY — PX: ANTERIOR CERVICAL DECOMP/DISCECTOMY FUSION: SHX1161

## 2013-09-04 LAB — GLUCOSE, CAPILLARY: Glucose-Capillary: 282 mg/dL — ABNORMAL HIGH (ref 70–99)

## 2013-09-04 SURGERY — ANTERIOR CERVICAL DECOMPRESSION/DISCECTOMY FUSION 1 LEVEL/HARDWARE REMOVAL
Anesthesia: General | Site: Neck

## 2013-09-04 MED ORDER — CEFAZOLIN SODIUM-DEXTROSE 2-3 GM-% IV SOLR
INTRAVENOUS | Status: AC
  Administered 2013-09-04: 2 g via INTRAVENOUS
  Filled 2013-09-04: qty 50

## 2013-09-04 MED ORDER — SODIUM CHLORIDE 0.9 % IR SOLN
Status: DC | PRN
  Administered 2013-09-04: 08:00:00

## 2013-09-04 MED ORDER — HYDROMORPHONE HCL PF 1 MG/ML IJ SOLN
INTRAMUSCULAR | Status: AC
  Filled 2013-09-04: qty 1

## 2013-09-04 MED ORDER — 0.9 % SODIUM CHLORIDE (POUR BTL) OPTIME
TOPICAL | Status: DC | PRN
  Administered 2013-09-04: 1000 mL

## 2013-09-04 MED ORDER — HYDROCODONE-ACETAMINOPHEN 5-325 MG PO TABS: 1.0000 | ORAL_TABLET | ORAL | Status: DC | PRN

## 2013-09-04 MED ORDER — LIDOCAINE-EPINEPHRINE 1 %-1:100000 IJ SOLN
INTRAMUSCULAR | Status: DC | PRN
  Administered 2013-09-04: 5 mL

## 2013-09-04 MED ORDER — MUPIROCIN 2 % EX OINT
TOPICAL_OINTMENT | CUTANEOUS | Status: AC
  Filled 2013-09-04: qty 22

## 2013-09-04 MED ORDER — MAGNESIUM HYDROXIDE 400 MG/5ML PO SUSP: 30.0000 mL | Freq: Every day | ORAL | Status: DC | PRN

## 2013-09-04 MED ORDER — PANTOPRAZOLE SODIUM 40 MG PO TBEC
80.0000 mg | DELAYED_RELEASE_TABLET | Freq: Every day | ORAL | Status: DC
  Filled 2013-09-04 (×2): qty 2

## 2013-09-04 MED ORDER — BUPIVACAINE HCL (PF) 0.5 % IJ SOLN
INTRAMUSCULAR | Status: DC | PRN
  Administered 2013-09-04: 5 mL

## 2013-09-04 MED ORDER — ARTIFICIAL TEARS OP OINT
TOPICAL_OINTMENT | OPHTHALMIC | Status: DC | PRN
  Administered 2013-09-04: 1 via OPHTHALMIC

## 2013-09-04 MED ORDER — ONDANSETRON HCL 4 MG/2ML IJ SOLN: 4.0000 mg | Freq: Four times a day (QID) | INTRAMUSCULAR | Status: DC | PRN

## 2013-09-04 MED ORDER — PROPOFOL 10 MG/ML IV BOLUS
INTRAVENOUS | Status: DC | PRN
  Administered 2013-09-04: 175 mg via INTRAVENOUS

## 2013-09-04 MED ORDER — MAGNESIUM OXIDE 400 (241.3 MG) MG PO TABS
400.0000 mg | ORAL_TABLET | Freq: Every day | ORAL | Status: DC
  Filled 2013-09-04 (×3): qty 1

## 2013-09-04 MED ORDER — KETOROLAC TROMETHAMINE 30 MG/ML IJ SOLN
30.0000 mg | Freq: Four times a day (QID) | INTRAMUSCULAR | Status: DC
  Administered 2013-09-04 – 2013-09-05 (×4): 30 mg via INTRAVENOUS
  Filled 2013-09-04 (×7): qty 1

## 2013-09-04 MED ORDER — MUPIROCIN 2 % EX OINT
1.0000 "application " | TOPICAL_OINTMENT | Freq: Two times a day (BID) | CUTANEOUS | Status: DC
  Administered 2013-09-04: 1 via NASAL

## 2013-09-04 MED ORDER — OXYCODONE-ACETAMINOPHEN 5-325 MG PO TABS: 1.0000 | ORAL_TABLET | ORAL | Status: DC | PRN

## 2013-09-04 MED ORDER — MENTHOL 3 MG MT LOZG
1.0000 | LOZENGE | OROMUCOSAL | Status: DC | PRN
  Filled 2013-09-04 (×2): qty 9

## 2013-09-04 MED ORDER — GLYCOPYRROLATE 0.2 MG/ML IJ SOLN
INTRAMUSCULAR | Status: DC | PRN
  Administered 2013-09-04 (×2): 0.1 mg via INTRAVENOUS
  Administered 2013-09-04: 0.6 mg via INTRAVENOUS

## 2013-09-04 MED ORDER — HEMOSTATIC AGENTS (NO CHARGE) OPTIME
TOPICAL | Status: DC | PRN
  Administered 2013-09-04: 1 via TOPICAL

## 2013-09-04 MED ORDER — PHENYLEPHRINE HCL 10 MG/ML IJ SOLN
INTRAMUSCULAR | Status: DC | PRN
  Administered 2013-09-04 (×2): 80 ug via INTRAVENOUS

## 2013-09-04 MED ORDER — PHENOL 1.4 % MT LIQD
1.0000 | OROMUCOSAL | Status: DC | PRN
  Filled 2013-09-04: qty 177

## 2013-09-04 MED ORDER — ROCURONIUM BROMIDE 100 MG/10ML IV SOLN
INTRAVENOUS | Status: DC | PRN
  Administered 2013-09-04: 50 mg via INTRAVENOUS

## 2013-09-04 MED ORDER — NEOSTIGMINE METHYLSULFATE 1 MG/ML IJ SOLN
INTRAMUSCULAR | Status: DC | PRN
  Administered 2013-09-04: 4 mg via INTRAVENOUS

## 2013-09-04 MED ORDER — DEXAMETHASONE SODIUM PHOSPHATE 4 MG/ML IJ SOLN
INTRAMUSCULAR | Status: DC | PRN
  Administered 2013-09-04: 8 mg via INTRAVENOUS

## 2013-09-04 MED ORDER — CYCLOBENZAPRINE HCL 10 MG PO TABS: 10.0000 mg | ORAL_TABLET | Freq: Three times a day (TID) | ORAL | Status: DC | PRN

## 2013-09-04 MED ORDER — KETOROLAC TROMETHAMINE 30 MG/ML IJ SOLN
INTRAMUSCULAR | Status: AC
  Filled 2013-09-04: qty 1

## 2013-09-04 MED ORDER — SODIUM CHLORIDE 0.9 % IV SOLN: 250.0000 mL | INTRAVENOUS | Status: DC

## 2013-09-04 MED ORDER — PHENYLEPHRINE HCL 10 MG/ML IJ SOLN
10.0000 mg | INTRAVENOUS | Status: DC | PRN
  Administered 2013-09-04: 20 ug/min via INTRAVENOUS

## 2013-09-04 MED ORDER — ONDANSETRON HCL 4 MG/2ML IJ SOLN
INTRAMUSCULAR | Status: DC | PRN
  Administered 2013-09-04: 4 mg via INTRAVENOUS

## 2013-09-04 MED ORDER — KETOROLAC TROMETHAMINE 30 MG/ML IJ SOLN: 30.0000 mg | Freq: Once | INTRAMUSCULAR | Status: DC

## 2013-09-04 MED ORDER — DEXTROSE-NACL 5-0.45 % IV SOLN: INTRAVENOUS | Status: DC

## 2013-09-04 MED ORDER — SODIUM CHLORIDE 0.9 % IJ SOLN
3.0000 mL | Freq: Two times a day (BID) | INTRAMUSCULAR | Status: DC
  Administered 2013-09-04: 3 mL via INTRAVENOUS

## 2013-09-04 MED ORDER — HYDROXYZINE HCL 25 MG PO TABS: 50.0000 mg | ORAL_TABLET | ORAL | Status: DC | PRN

## 2013-09-04 MED ORDER — LACTATED RINGERS IV SOLN
INTRAVENOUS | Status: DC | PRN
  Administered 2013-09-04 (×2): via INTRAVENOUS

## 2013-09-04 MED ORDER — ZOLPIDEM TARTRATE 5 MG PO TABS: 10.0000 mg | ORAL_TABLET | Freq: Every evening | ORAL | Status: DC | PRN

## 2013-09-04 MED ORDER — MUPIROCIN 2 % EX OINT
TOPICAL_OINTMENT | Freq: Two times a day (BID) | CUTANEOUS | Status: DC
  Administered 2013-09-04: 06:00:00 via NASAL
  Filled 2013-09-04: qty 22

## 2013-09-04 MED ORDER — ACETAMINOPHEN 325 MG PO TABS
650.0000 mg | ORAL_TABLET | ORAL | Status: DC | PRN
  Administered 2013-09-04: 650 mg via ORAL
  Filled 2013-09-04: qty 2

## 2013-09-04 MED ORDER — HYDROMORPHONE HCL PF 1 MG/ML IJ SOLN
0.2500 mg | INTRAMUSCULAR | Status: DC | PRN
  Administered 2013-09-04 (×4): 0.5 mg via INTRAVENOUS

## 2013-09-04 MED ORDER — SODIUM CHLORIDE 0.9 % IJ SOLN: 3.0000 mL | INTRAMUSCULAR | Status: DC | PRN

## 2013-09-04 MED ORDER — MORPHINE SULFATE 4 MG/ML IJ SOLN: 4.0000 mg | INTRAMUSCULAR | Status: DC | PRN

## 2013-09-04 MED ORDER — ALUM & MAG HYDROXIDE-SIMETH 200-200-20 MG/5ML PO SUSP: 30.0000 mL | Freq: Four times a day (QID) | ORAL | Status: DC | PRN

## 2013-09-04 MED ORDER — MIDAZOLAM HCL 5 MG/5ML IJ SOLN
INTRAMUSCULAR | Status: DC | PRN
  Administered 2013-09-04 (×2): 1 mg via INTRAVENOUS

## 2013-09-04 MED ORDER — CEFAZOLIN SODIUM-DEXTROSE 2-3 GM-% IV SOLR: 2.0000 g | INTRAVENOUS | Status: DC

## 2013-09-04 MED ORDER — THROMBIN 5000 UNITS EX SOLR
CUTANEOUS | Status: DC | PRN
  Administered 2013-09-04 (×2): 5000 [IU] via TOPICAL

## 2013-09-04 MED ORDER — FENTANYL CITRATE 0.05 MG/ML IJ SOLN
INTRAMUSCULAR | Status: DC | PRN
  Administered 2013-09-04 (×4): 50 ug via INTRAVENOUS

## 2013-09-04 MED ORDER — DOCUSATE SODIUM 100 MG PO CAPS
100.0000 mg | ORAL_CAPSULE | Freq: Two times a day (BID) | ORAL | Status: DC
  Administered 2013-09-04 (×2): 100 mg via ORAL
  Filled 2013-09-04 (×4): qty 1

## 2013-09-04 MED ORDER — FENTANYL CITRATE 0.05 MG/ML IJ SOLN
INTRAMUSCULAR | Status: AC
  Filled 2013-09-04: qty 2

## 2013-09-04 MED ORDER — BISACODYL 10 MG RE SUPP
10.0000 mg | Freq: Every day | RECTAL | Status: DC | PRN
Start: 2013-09-04 — End: 2013-09-05

## 2013-09-04 MED ORDER — LIDOCAINE HCL (CARDIAC) 20 MG/ML IV SOLN
INTRAVENOUS | Status: DC | PRN
  Administered 2013-09-04: 50 mg via INTRAVENOUS

## 2013-09-04 MED ORDER — ACETAMINOPHEN 650 MG RE SUPP
650.0000 mg | RECTAL | Status: DC | PRN
Start: 2013-09-04 — End: 2013-09-05

## 2013-09-04 SURGICAL SUPPLY — 55 items
ADH SKN CLS APL DERMABOND .7 (GAUZE/BANDAGES/DRESSINGS) ×1
ADH SKN CLS LQ APL DERMABOND (GAUZE/BANDAGES/DRESSINGS) ×1
ALLOGRAFT 7X14X11 (Bone Implant) ×1 IMPLANT
BAG DECANTER FOR FLEXI CONT (MISCELLANEOUS) ×2 IMPLANT
BIT DRILL NEURO 2X3.1 SFT TUCH (MISCELLANEOUS) ×1 IMPLANT
BLADE ULTRA TIP 2M (BLADE) ×2 IMPLANT
BRUSH SCRUB EZ PLAIN DRY (MISCELLANEOUS) ×2 IMPLANT
CANISTER SUCT 3000ML (MISCELLANEOUS) ×2 IMPLANT
CONT SPEC 4OZ CLIKSEAL STRL BL (MISCELLANEOUS) ×2 IMPLANT
COVER MAYO STAND STRL (DRAPES) ×2 IMPLANT
DECANTER SPIKE VIAL GLASS SM (MISCELLANEOUS) ×2 IMPLANT
DERMABOND ADHESIVE PROPEN (GAUZE/BANDAGES/DRESSINGS) ×1
DERMABOND ADVANCED (GAUZE/BANDAGES/DRESSINGS) ×1
DERMABOND ADVANCED .7 DNX12 (GAUZE/BANDAGES/DRESSINGS) ×1 IMPLANT
DERMABOND ADVANCED .7 DNX6 (GAUZE/BANDAGES/DRESSINGS) IMPLANT
DRAPE LAPAROTOMY 100X72 PEDS (DRAPES) ×2 IMPLANT
DRAPE MICROSCOPE LEICA (MISCELLANEOUS) ×2 IMPLANT
DRAPE POUCH INSTRU U-SHP 10X18 (DRAPES) ×2 IMPLANT
DRAPE PROXIMA HALF (DRAPES) IMPLANT
DRILL NEURO 2X3.1 SOFT TOUCH (MISCELLANEOUS) ×2
ELECT COATED BLADE 2.86 ST (ELECTRODE) ×2 IMPLANT
ELECT REM PT RETURN 9FT ADLT (ELECTROSURGICAL) ×2
ELECTRODE REM PT RTRN 9FT ADLT (ELECTROSURGICAL) ×1 IMPLANT
GLOVE BIOGEL PI IND STRL 8 (GLOVE) ×1 IMPLANT
GLOVE BIOGEL PI INDICATOR 8 (GLOVE) ×1
GLOVE ECLIPSE 7.5 STRL STRAW (GLOVE) ×2 IMPLANT
GLOVE EXAM NITRILE LRG STRL (GLOVE) IMPLANT
GLOVE EXAM NITRILE MD LF STRL (GLOVE) IMPLANT
GLOVE EXAM NITRILE XL STR (GLOVE) IMPLANT
GLOVE EXAM NITRILE XS STR PU (GLOVE) IMPLANT
GOWN BRE IMP SLV AUR LG STRL (GOWN DISPOSABLE) IMPLANT
GOWN BRE IMP SLV AUR XL STRL (GOWN DISPOSABLE) IMPLANT
GOWN STRL REIN 2XL LVL4 (GOWN DISPOSABLE) IMPLANT
GOWN STRL REUS W/TWL XL LVL4 (GOWN DISPOSABLE) ×3 IMPLANT
HEAD HALTER (SOFTGOODS) ×2 IMPLANT
KIT BASIN OR (CUSTOM PROCEDURE TRAY) ×2 IMPLANT
KIT ROOM TURNOVER OR (KITS) ×2 IMPLANT
NDL HYPO 25X1 1.5 SAFETY (NEEDLE) ×1 IMPLANT
NDL SPNL 22GX3.5 QUINCKE BK (NEEDLE) ×1 IMPLANT
NEEDLE HYPO 25X1 1.5 SAFETY (NEEDLE) ×2 IMPLANT
NEEDLE SPNL 22GX3.5 QUINCKE BK (NEEDLE) ×2 IMPLANT
NS IRRIG 1000ML POUR BTL (IV SOLUTION) ×2 IMPLANT
PACK LAMINECTOMY NEURO (CUSTOM PROCEDURE TRAY) ×2 IMPLANT
PAD ARMBOARD 7.5X6 YLW CONV (MISCELLANEOUS) ×6 IMPLANT
PLATE CERVICAL 16MM (Plate) ×1 IMPLANT
RUBBERBAND STERILE (MISCELLANEOUS) ×4 IMPLANT
SCREW FIXED 4.5X13MM (Screw) ×2 IMPLANT
SCREW VAR 4.0X13MM (Screw) ×2 IMPLANT
SPONGE INTESTINAL PEANUT (DISPOSABLE) ×2 IMPLANT
SUT VIC AB 2-0 CP2 18 (SUTURE) ×2 IMPLANT
SUT VIC AB 3-0 SH 8-18 (SUTURE) ×2 IMPLANT
SYR 20ML ECCENTRIC (SYRINGE) ×2 IMPLANT
TOWEL OR 17X24 6PK STRL BLUE (TOWEL DISPOSABLE) IMPLANT
TOWEL OR 17X26 10 PK STRL BLUE (TOWEL DISPOSABLE) ×2 IMPLANT
WATER STERILE IRR 1000ML POUR (IV SOLUTION) ×2 IMPLANT

## 2013-09-04 NOTE — Progress Notes (Signed)
Filed Vitals:   09/04/13 1045 09/04/13 1048 09/04/13 1100 09/04/13 1630  BP:   102/69 127/84  Pulse: 62 66 61 62  Temp: 97.1 F (36.2 C)  97.8 F (36.6 C) 97.4 F (36.3 C)  TempSrc:      Resp: 13 10 20 18   SpO2: 99% 99% 93% 95%    CBC  Recent Labs  09/03/13 1113  WBC 5.8  HGB 13.8  HCT 40.2  PLT 318   BMET  Recent Labs  09/03/13 1103  NA 138  K 4.6  CL 98  CO2 27  GLUCOSE 97  BUN 20  CREATININE 0.71  CALCIUM 9.9    Patient with good relief of right cervical neck pain. Mild intermittent left cervical radicular irritation. Wound clean and dry. Has voided. Moving all 4 extremities well. Has ambulate in the halls.  Plan: Continued to progress to postoperative recovery.  Hewitt ShortsNUDELMAN,ROBERT W, MD 09/04/2013, 6:35 PM

## 2013-09-04 NOTE — Anesthesia Postprocedure Evaluation (Signed)
  Anesthesia Post-op Note  Patient: Melanie Aguirre  Procedure(s) Performed: Procedure(s) with comments: ANTERIOR CERVICAL DECOMPRESSION/DISCECTOMY FUSION 1 LEVEL/HARDWARE REMOVAL (N/A) - Hardware Removal Tether Plate Z6-1C4-5  Patient Location: PACU  Anesthesia Type:General  Level of Consciousness: awake  Airway and Oxygen Therapy: Patient Spontanous Breathing  Post-op Pain: mild  Post-op Assessment: Post-op Vital signs reviewed  Post-op Vital Signs: Reviewed  Complications: No apparent anesthesia complications

## 2013-09-04 NOTE — Transfer of Care (Signed)
Immediate Anesthesia Transfer of Care Note  Patient: Melanie Aguirre  Procedure(s) Performed: Procedure(s) with comments: ANTERIOR CERVICAL DECOMPRESSION/DISCECTOMY FUSION 1 LEVEL/HARDWARE REMOVAL (N/A) - Hardware Removal Tether Plate W0-9C4-5  Patient Location: PACU  Anesthesia Type:General  Level of Consciousness: awake, alert  and oriented  Airway & Oxygen Therapy: Patient Spontanous Breathing and Patient connected to nasal cannula oxygen  Post-op Assessment: Report given to PACU RN, Post -op Vital signs reviewed and stable and Patient moving all extremities X 4  Post vital signs: Reviewed and stable  Complications: No apparent anesthesia complications

## 2013-09-04 NOTE — Op Note (Signed)
09/04/2013  9:26 AM  PATIENT:  Melanie Aguirre  53 y.o. 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  ANESTHESIA:   general  EBL:  Total I/O In: 1000 [I.V.:1000] Out: 200 [Blood:200]  BLOOD ADMINISTERED:none  COUNT: Correct per nursing staff  DICTATION: Patient was brought to the operating room placed under general endotracheal anesthesia. Patient was placed in 10 pounds of halter traction. The neck was prepped with Betadine soap and solution and draped in a sterile fashion. A horizontal incision was made on the left side of the neck. The line of the incision was infiltrated with local anesthetic with epinephrine. Dissection was carried down thru the subcutaneous tissue and platysma, bipolar cautery was used to maintain hemostasis. Dissection was then carried down thru an avascular plane leaving the sternocleidomastoid carotid artery and jugular vein laterally and the trachea and esophagus medially. The ventral aspect of the vertebral column was identified and we identified the existing anterior cervical plate from W9-6. The thin scar tissue over the plate was cleared off, and then each of the 5 screws was unlocked, and then removed. We then removed the plate without difficulty. Each of the screw holes was packed with Gelfoam with thrombin. The C4-5 level was then identified. The  annulus was incised and the disc space entered. Discectomy was performed with micro-curettes and pituitary rongeurs. The operating microscope was draped and brought into the field provided additional magnification illumination and visualization. Discectomy was continued posteriorly thru the disc space and then the cartilaginous endplate was removed using micro-curettes along with the high-speed drill. Posterior osteophytic overgrowth was removed using the high-speed drill along with a 2 mm thin footplated Kerrison punch. Posterior longitudinal ligament along with disc herniation was carefully removed, decompressing the spinal canal and thecal sac. We then continued to remove osteophytic overgrowth and disc material decompressing the neural foramina and exiting nerve roots bilaterally. Once the decompression was completed hemostasis was established with the use of Gelfoam with thrombin and bipolar cautery. The Gelfoam was removed the wound irrigated and hemostasis confirmed. We then measured the height of the intravertebral disc space and selected a 7 millimeter in height structural allograft. It was hydrated and saline solution and then gently positioned in the intravertebral disc space and countersunk. We then selected a 16 millimeter in height Tether cervical plate. It was positioned over the fusion construct and secured to the vertebra with 4 x 13 mm variable screws at the C4 level, and 4.5 x 13 mm fixed screws at the C5 level. We first placed the screws at the existing screw holes at C5. Each screw hole at C4 was started with the high-speed drill and then the screws placed.  Once all the screws were placed final tightening was performed. The wound was irrigated with bacitracin solution checked for hemostasis which was established and confirmed. An x-ray was taken which showed grafts in good position, the plate and screws in good position. We then proceeded with closure. The platysma was closed with interrupted  inverted 2-0 undyed Vicryl suture, the  subcutaneous and subcuticular closed with interrupted inverted 3-0 undyed Vicryl suture. The skin edges were approximated with Dermabond. Following surgery the patient was taken out of cervical traction. To be reversed and the anesthetic and taken to the recovery room for further care.   PLAN OF CARE: Admit for overnight observation  PATIENT DISPOSITION:  PACU - hemodynamically stable.   Delay start of Pharmacological VTE agent (>24hrs) due to surgical blood loss or risk of bleeding:  yes

## 2013-09-04 NOTE — Progress Notes (Signed)
Called for sign out

## 2013-09-04 NOTE — Anesthesia Preprocedure Evaluation (Addendum)
Anesthesia Evaluation  Patient identified by MRN, date of birth, ID band Patient awake    Reviewed: Allergy & Precautions, H&P , NPO status , Patient's Chart, lab work & pertinent test results  Airway Mallampati: II      Dental   Pulmonary  breath sounds clear to auscultation        Cardiovascular + Peripheral Vascular Disease Rhythm:Regular Rate:Normal     Neuro/Psych    GI/Hepatic negative GI ROS, Neg liver ROS,   Endo/Other    Renal/GU negative Renal ROS     Musculoskeletal   Abdominal   Peds  Hematology   Anesthesia Other Findings   Reproductive/Obstetrics                           Anesthesia Physical Anesthesia Plan  ASA: II  Anesthesia Plan: General   Post-op Pain Management:    Induction:   Airway Management Planned: Oral ETT  Additional Equipment:   Intra-op Plan:   Post-operative Plan: Extubation in OR  Informed Consent: I have reviewed the patients History and Physical, chart, labs and discussed the procedure including the risks, benefits and alternatives for the proposed anesthesia with the patient or authorized representative who has indicated his/her understanding and acceptance.   Dental advisory given  Plan Discussed with: CRNA and Anesthesiologist  Anesthesia Plan Comments:         Anesthesia Quick Evaluation

## 2013-09-04 NOTE — H&P (Signed)
Subjective: Patient is a 53 y.o. female who is admitted for treatment of right C5 radiculopathy secondary to right C4-5 cervical disc herniation. Patient is status post previous C5-6 and C6-7 ACDF in September of 2011. Symptoms began about a month ago, with pain radiating to the right upper extremity. Examination shows significant weakness of the right deltoid and biceps. Patient is admitted now for a C4-5 anterior cervical decompression and arthrodesis.   Patient Active Problem List   Diagnosis Date Noted  . 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 Transient diplopia 03/18/2013  . Intracerebral aneurysm    Past Medical History  Diagnosis Date  . Papilledema associated with decreased ocular pressure   . DJD (degenerative joint disease), cervical     history of fusion  . 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 Back spasm 1998    episodic severe low back, felt to have probably from lthe L5-S1 area. Usually responds to oxycodone one or two doses.  . Anxiety     last 4 months   . Intracerebral aneurysm   . 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 Anemia     as a teenager  . Eczema   . Family history of anesthesia complication     daughter has bad nausea and vomiting    Past Surgical History  Procedure Laterality Date  . Cervical spine surgery  04/2010    Dr Lauralyn Primes  . Intracranial aneurysm repair  09/2012    coiled-Dr Denny Peon [facility]  . Breast surgery Right     biopsy (negative)    Prescriptions prior to admission  Medication Sig Dispense Refill  . magnesium oxide (MAG-OX) 400 MG tablet Take 400 mg by mouth daily.      . Multiple Vitamin (MULTIVITAMIN) tablet Take 1 tablet by mouth daily.      Marland Kitchen omega-3 acid ethyl esters (LOVAZA) 1 G capsule Take 1 g by mouth daily.       Marland Kitchen triamcinolone cream (KENALOG) 0.1 % Apply 1 application  topically 2 (two) times daily.       Marland Kitchen VITAMIN D, CHOLECALCIFEROL, PO Take 2,000 Units by mouth daily.      Marland Kitchen omeprazole (PRILOSEC) 40 MG capsule Take 40 mg by mouth daily. Delayed release,as needed      . valGANciclovir (VALCYTE) 450 MG tablet Take 450 mg by mouth as needed (for cold sores).       . zolpidem (AMBIEN) 10 MG tablet Take 10 mg by mouth at bedtime as needed for sleep.        Allergies  Allergen Reactions  . Erythromycin     Rash, shortness of breath  . Lavender Oil Swelling    Makes eyes swell    History  Substance Use Topics  . Smoking status: Never Smoker   . Smokeless tobacco: Never Used  . Alcohol Use: Yes     Comment: 4-5 times a week    Family History  Problem Relation Age of Onset  . Hypertension Father   . Congestive Heart Failure Maternal Grandfather   . Cancer Paternal Grandmother     breast   . Cancer Paternal Grandfather     lung, throat     Review of Systems A comprehensive review of systems was negative.  Objective: Vital signs in last 24 hours: Temp:  [97.5 F (36.4 C)-97.9 F (36.6 C)] 97.5  F (36.4 C) (01/15 [id number]) Pulse Rate:  [71-87] 71 (01/15 0633) Resp:  [16-20] 16 (01/15 0633) BP: (128-152)/(82-91) 128/82 mmHg (01/15 0633) SpO2:  [97 %-99 %] 99 % (01/15 0633) Weight:  [70.988 kg (156 lb 8 oz)] 70.988 kg (156 lb 8 oz) (01/14 1022)  EXAM: Patient well-developed well-nourished white female in no acute distress. Lungs are clear to auscultation , the patient has symmetrical respiratory excursion. Heart has a regular rate and rhythm normal S1 and S2 no murmur.   Abdomen is soft nontender nondistended bowel sounds are present. Extremity examination shows no clubbing cyanosis or edema. Neurologic examination shows the right deltoid is 4, right biceps is 4 minus. The remainder the reduction the strength including the triceps, intrinsics, and grip are 5. The left upper extremity she is far 5, including the deltoid, biceps, triceps, intrinsics,  and grip. Sensation is intact to pinprick the digits of the upper extremities. Reflexes are shows a left biceps is 1+, left brachial radialis and triceps are minimal. The right biceps and brachial radialis absent, right triceps is 1. The quadriceps are 1 bilaterally and the gastrocnemius are absent bilaterally, the toes are downgoing bilaterally. She has had normal gait and stance  Data Review:CBC    Component Value Date/Time   WBC 5.8 09/03/2013 1113   WBC 5.9 07/24/2013 0906   RBC 4.28 09/03/2013 1113   RBC 4.15 07/24/2013 0906   HGB 13.8 09/03/2013 1113   HCT 40.2 09/03/2013 1113   PLT 318 09/03/2013 1113   MCV 93.9 09/03/2013 1113   MCH 32.2 09/03/2013 1113   MCH 32.0 07/24/2013 0906   MCHC 34.3 09/03/2013 1113   MCHC 35.4 07/24/2013 0906   RDW 13.1 09/03/2013 1113   RDW 12.6 07/24/2013 0906                          BMET    Component Value Date/Time   NA 138 09/03/2013 1103   NA 137 07/24/2013 0906   K 4.6 09/03/2013 1103   CL 98 09/03/2013 1103   CO2 27 09/03/2013 1103   GLUCOSE 97 09/03/2013 1103   GLUCOSE 89 07/24/2013 0906   BUN 20 09/03/2013 1103   BUN 15 07/24/2013 0906   CREATININE 0.71 09/03/2013 1103   CALCIUM 9.9 09/03/2013 1103   GFRNONAA >90 09/03/2013 1103   GFRAA >90 09/03/2013 1103     Assessment/Plan: Patient with a right cervical radiculopathy secondary to a right C4-5 cervical disc, the patient is status post previous C5-6 and C6-7 ACDF. Patient is admitted now for a C4-5 ACDF.  I've discussed with the patient the nature of his condition, the nature the surgical procedure, the typical length of surgery, hospital stay, and overall recuperation. We discussed limitations postoperatively. I discussed risks of surgery including risks of infection, bleeding, possibly need for transfusion, the risk of nerve root dysfunction with pain, weakness, numbness, or paresthesias, the risk of spinal cord dysfunction with paralysis of all 4 limbs and quadriplegia, and the risk of dural tear and CSF  leakage and possible need for further surgery, the risk of esophageal dysfunction causing dysphagia and the risk of laryngeal dysfunction causing hoarseness of the voice, the risk of failure of the arthrodesis and the possible need for further surgery, and the risk of anesthetic complications including myocardial infarction, stroke, pneumonia, and death. We also discussed the need for postoperative immobilization in a cervical collar. Understanding all this the patient does wish to proceed with  surgery and is admitted for such.    Hewitt Shorts, MD 09/04/2013 7:23 AM

## 2013-09-04 NOTE — Anesthesia Procedure Notes (Signed)
Procedure Name: Intubation Date/Time: 09/04/2013 7:40 AM Performed by: Gayla MedicusHYPES, Denzil Bristol M. Pre-anesthesia Checklist: Patient identified, Timeout performed, Emergency Drugs available, Suction available and Patient being monitored Patient Re-evaluated:Patient Re-evaluated prior to inductionOxygen Delivery Method: Circle system utilized Preoxygenation: Pre-oxygenation with 100% oxygen Intubation Type: IV induction Ventilation: Mask ventilation without difficulty and Oral airway inserted - appropriate to patient size Laryngoscope Size: Mac and 3 Grade View: Grade I Tube type: Oral Tube size: 7.5 mm Number of attempts: 1 Airway Equipment and Method: Stylet Placement Confirmation: ETT inserted through vocal cords under direct vision,  positive ETCO2 and breath sounds checked- equal and bilateral Secured at: 21 cm Tube secured with: Tape Dental Injury: Teeth and Oropharynx as per pre-operative assessment

## 2013-09-04 NOTE — Preoperative (Signed)
Beta Blockers   Reason not to administer Beta Blockers:Not Applicable 

## 2013-09-05 MED ORDER — TRAMADOL HCL 50 MG PO TABS: 50.0000 mg | ORAL_TABLET | Freq: Four times a day (QID) | ORAL | Status: DC | PRN

## 2013-09-05 NOTE — Discharge Instructions (Signed)

## 2013-09-05 NOTE — Discharge Summary (Signed)
Physician Discharge Summary  Patient ID: Melanie Aguirre MRN: [id number] DOB/AGE: Dec 30, 1960 53 y.o.  Admit date: 09/04/2013 Discharge date: 09/05/2013  Admission Diagnoses:  Right C4-5 cervical disc herniation, cervical spondylosis, cervical degenerative disease, right C5 cervical radiculopathy  Discharge Diagnoses:  Right C4-5 cervical disc herniation, cervical spondylosis, cervical degenerative disease, right C5 cervical radiculopathy  Active Problems:   HNP (herniated nucleus pulposus), cervical  Discharged Condition: good  Hospital Course: Patient was admitted, underwent a C4-5 ACDF (and removal of existing C5-C7 plate). Postoperatively she has had good relief of her right cervical radicular pain. Wound is healing well. She is up and ambulate actively. She is asking to be discharged to home. She's been given instructions regarding wound care activities. She is to return for followup with me in 3 weeks.  Discharge Exam: Blood pressure 109/66, pulse 64, temperature 98.7 F (37.1 C), temperature source Oral, resp. rate 16, SpO2 93.00%.  Disposition: Home   Future Appointments Provider Department Dept Phone   12/24/2013 2:00 PM Melvyn Novasarmen Dohmeier, MD [facility] [phone]       Medication List         magnesium oxide 400 MG tablet  Commonly known as:  MAG-OX  Take 400 mg by mouth daily.     multivitamin tablet  Take 1 tablet by mouth daily.     omega-3 acid ethyl esters 1 G capsule  Commonly known as:  LOVAZA  Take 1 g by mouth daily.     omeprazole 40 MG capsule  Commonly known as:  PRILOSEC  Take 40 mg by mouth daily. Delayed release,as needed     traMADol 50 MG tablet  Commonly known as:  ULTRAM  Take 1 tablet (50 mg total) by mouth every 6 (six) hours as needed for moderate pain.     triamcinolone cream 0.1 %  Commonly known as:  KENALOG  Apply 1 application topically 2 (two) times daily.     valGANciclovir 450 MG tablet  Commonly known  as:  VALCYTE  Take 450 mg by mouth as needed (for cold sores).     VITAMIN D (CHOLECALCIFEROL) PO  Take 2,000 Units by mouth daily.     zolpidem 10 MG tablet  Commonly known as:  AMBIEN  Take 10 mg by mouth at bedtime as needed for sleep.         Signed: Hewitt ShortsNUDELMAN,ROBERT W, MD 09/05/2013, 8:14 AM

## 2013-09-05 NOTE — Progress Notes (Signed)
Pt. discharged home accompanied by husband. Prescriptions and discharge instructions given with verbalization of understanding. Incision site on neck with no s/s of infection - no swelling, redness, bleeding, and/or drainage noted. Soft collar intact. All questions answered.. Pt. transported out of this unit in wheelchair by the volunteer.

## 2013-09-09 ENCOUNTER — Encounter (HOSPITAL_COMMUNITY): Payer: Self-pay | Admitting: Neurosurgery

## 2013-12-24 ENCOUNTER — Encounter: Payer: Self-pay | Admitting: Neurology

## 2013-12-24 ENCOUNTER — Ambulatory Visit (INDEPENDENT_AMBULATORY_CARE_PROVIDER_SITE_OTHER): Payer: 59 | Admitting: Neurology

## 2013-12-24 VITALS — BP 139/91 | HR 98 | Resp 16 | Ht 66.0 in | Wt 159.0 lb

## 2013-12-24 DIAGNOSIS — H5702 Anisocoria: Secondary | ICD-10-CM

## 2013-12-24 NOTE — Addendum Note (Signed)
Addended by: Melvyn NovasHMEIER, Coreon Simkins on: 12/24/2013 02:53 PM   Modules accepted: Orders

## 2013-12-24 NOTE — Patient Instructions (Addendum)
Yearly RV . MRA brain in July -August , than yearly ,

## 2013-12-24 NOTE — Progress Notes (Signed)
[facility]  Provider:  [name], M D  Referring Provider: Marden Noble, MD Primary Care Physician:  Pearla Dubonnet, MD  Chief Complaint  Patient presents with  . Follow-up    Room 10  . Neurologic Problem    HPI:  Melanie Aguirre is a 53 y.o. female .  Who is  seen here as a revisit from Dr. 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    Please review the preceding tests; no diagnosis has been made as to the paroxysmal nature of the diplopia.   Interval history :  In December 2014, she underwent a carotid doppler study , which failed to documented any injury to the carotid bulb , and could not explain the pupillary abnormality.   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  Dr Newell Coral performed the fusion surgery in January.  An EMG and NCS was normal in 2014.  This explains some of her dysesthesias.  She now can carry some weight with her right hand again. She has recurrent tingling, and feels she healed well.     Review of Systems: Out of a complete 14 system review, the patient complains of only the following symptoms, and all other reviewed systems are negative.  Improving right hand motor function. Still  Diplopia, neck stiffness,    History   Social History  . Marital Status: Married    Spouse Name: Melanie Aguirre    Number of Children: 3  . Years of Education: BA3   Occupational History  . not employed    Social History Main Topics  . Smoking status: Never Smoker   . Smokeless tobacco: Never Used  . Alcohol Use: Yes     Comment: 4-5 times a week  . Drug Use: No  . Sexual Activity: Not on file   Other Topics Concern  . Not on file   Social History Narrative   Patient lives at home husband Melanie Aguirre.    Patient has 3 children.    Patient is retired.    Patient has a college degree.    Patient is  right-handed.   Patient drinks 2-3 cups of coffee and tea sometimes but not everyday.    Family History  Problem Relation Age of Onset  . Hypertension Father   . Congestive Heart Failure Maternal Grandfather   . Cancer Paternal Grandmother     breast   . Cancer Paternal Grandfather     lung, throat    Past Medical History  Diagnosis Date  . Papilledema associated with decreased ocular pressure   . DJD (degenerative joint disease), cervical     history of fusion  . 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 Back spasm 1998    episodic severe low back, felt to have probably from lthe L5-S1 area. Usually responds to oxycodone one or two doses.  . Anxiety     last 4 months   . Intracerebral aneurysm   . 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 Anemia     as a teenager  . Eczema   . Family history of anesthesia complication     daughter has bad nausea and vomiting    Past Surgical History  Procedure  Laterality Date  . Cervical spine surgery  04/2010    Dr Lauralyn PrimesNudleman, Robert  . [facility]acranial [facility]  [facility]  . Breast surgery Right     biopsy (negative)  . Anterior cervical decomp/discectomy fusion N/A 09/04/2013    Procedure: ANTERIOR CERVICAL DECOMPRESSION/DISCECTOMY FUSION 1 LEVEL/HARDWARE REMOVAL;  Surgeon: Hewitt Shortsobert W Nudelman, MD;  Location: [facility];  Service: Neurosurgery;  Laterality: N/A;  Hardware Removal Tether Plate [id number]    Current Outpatient Prescriptions  Medication Sig Dispense Refill  . magnesium oxide (MAG-OX) 400 MG tablet Take 400 mg by mouth daily.      . Multiple Vitamin (MULTIVITAMIN) tablet Take 1 tablet by mouth daily.      Marland Kitchen. omega-3 acid ethyl esters (LOVAZA) 1 G capsule Take 1 g by mouth daily.       Marland Kitchen. omeprazole (PRILOSEC) 40 MG capsule Take 40 mg by mouth daily. Delayed  release,as needed      . triamcinolone cream (KENALOG) 0.1 % Apply 1 application topically 2 (two) times daily.       . valGANciclovir (VALCYTE) 450 MG tablet Take 450 mg by mouth as needed (for cold sores).       Marland Kitchen. VITAMIN D, CHOLECALCIFEROL, PO Take 2,000 Units by mouth daily.      Marland Kitchen. zolpidem (AMBIEN) 10 MG tablet Take 10 mg by mouth at bedtime as needed for sleep.        No current facility-administered medications for this visit.    Allergies as of 12/24/2013 - Review Complete 12/24/2013  Allergen Reaction Noted  . Erythromycin  03/17/2013  . Lavender oil Swelling 09/03/2013    Vitals: BP 139/91  Pulse 98  Resp 16  Ht 5\' 6"  (1.676 m)  Wt 159 lb (72.122 kg)  BMI 25.68 kg/m2 Last Weight:  Wt Readings from Last 1 Encounters:  12/24/13 159 lb (72.122 kg)   Last Height:   Ht Readings from Last 1 Encounters:  12/24/13 5\' 6"  (1.676 m)    Chief Complaint  Patient presents with  . Follow-up    Room 10  . Neurologic Problem   Interval history- and exam .   The patient has noted more frequent this normally a poorly regulated BP, her spells have continued they involve still pupillary dilation and an occipital motor or abnormality. Except for the diplopia her vision is not changing during the spells. He affected eye is the left pulmonary she did pretty well on her visual testing here missed only one line from 20/20 on the left eye.  Had repeated angiography last July this good report and is awaiting a follow up MRI in some of the next year.  The pain movements are associated visit pupillary dilation in the left eye ended with an up and in phenomena - diplopia is worse at distance, she is fairly unaffected when reading. When looking up and to the right causes the most interference.   corrective steps are for the patient to look down and out which limits the diplopia interference rather than medial and up vision.   Spells occur up to 50 times a day. She has spells of sudden high  blood pressure.   The dilation of the pupil,and the anisocoria at baseline are speaking for an autonomic nerve  injury , likely location along the retinal artery or artery ganglion.  Direct light causes constriction as is consensual constriction, but the left pupil relaxes and dilates with both maneuvers while the  right stays constricted.  That is a possible fatigue response?  There is normal EOM,  No ptosis today- Smooth per suit and normal  saccades.  She notices the eye deviation with movement- with her early AM trip to the bathroom.   General: The patient is awake, alert and appears not in acute distress. The patient is well groomed. Head: Normocephalic, atraumatic. Neck is rigid due to fusion- . Mallampati 2  neck circumference: 15.5 Cardiovascular:  Regular rate and rhythm , without  murmurs or carotid bruit, and without distended neck veins. Respiratory: Lungs are clear to auscultation. Skin:  Without evidence of edema, or rash Trunk: BMI is  elevated and patient has a normal posture.   Neurologic exam : The patient is awake and alert, oriented to place and time.  Memory subjective  described as intact.  There is a normal attention span & concentration ability. Speech is fluent without dysarthria, dysphonia or aphasia.  Mood and affect are appropriate.  Cranial nerves:  2 mm enlarged left pupil,  20/20 vision,   No ptosis today and no deviation.  Hearing to finger rub intact.  Facial sensation intact to fine touch.  Facial motor strength is symmetric and tongue and uvula move midline.  Motor exam:   Normal tone , muscle bulk and symmetric normal strength in all extremities. Her right had grip is intact , pronator weakness in the right arm.  Sensory:  Fine touch, pinprick and vibration were tested in all extremities. Proprioception is tested in the upper extremities only. This was  normal.  Coordination: Rapid alternating movements in the fingers/hands is tested and normal.  Finger-to-nose maneuver tested and normal without evidence of ataxia, dysmetria or tremor.  Gait and station: Patient walks without assistive device . Deep tendon reflexes: in the  upper and lower extremities are symmetric and intact. Babinski maneuver response is  downgoing.   Assessment:  After physical and neurologic examination, review of laboratory studies, imaging, neurophysiology testing and pre-existing records, assessment is:   Eye deviation and dilation of the pupil on the left . The patient feels a spasm in the eye . i can not explain these symptoms, they are objectively present.   Plan:  Treatment plan and additional workup will be reviewed under Problem List.  Yearly RV  for MRA and MRA ,      Plan : MRI with highest TESLA  Available to be performed yearly.   MRA yearly to follow up on aneurysm. patiet had an aneurysm coiled in 2013.  Midbrain and ophtalmic  / oculomotor pathway to be visualized .  with and without gadolinium . BP variation remains unexplained.

## 2014-01-09 ENCOUNTER — Other Ambulatory Visit: Payer: Self-pay

## 2014-01-09 DIAGNOSIS — Z1231 Encounter for screening mammogram for malignant neoplasm of breast: Secondary | ICD-10-CM

## 2014-03-02 ENCOUNTER — Ambulatory Visit
Admission: RE | Admit: 2014-03-02 | Discharge: 2014-03-02 | Disposition: A | Discharge status: Home / Self Care | Payer: 59 | Source: Ambulatory Visit

## 2014-03-02 DIAGNOSIS — Z1231 Encounter for screening mammogram for malignant neoplasm of breast: Secondary | ICD-10-CM

## 2014-03-05 ENCOUNTER — Ambulatory Visit
Admission: RE | Admit: 2014-03-05 | Discharge: 2014-03-05 | Disposition: A | Discharge status: Home / Self Care | Payer: 59 | Source: Ambulatory Visit | Attending: Neurology | Admitting: Neurology

## 2014-03-05 DIAGNOSIS — H5702 Anisocoria: Secondary | ICD-10-CM

## 2014-03-12 NOTE — Progress Notes (Signed)
Quick Note:  Left message with MRA head results, showing stable coiling, no additional developments of aneurysm, no MS or stroke, per Dr. Vickey Hugerohmeier. Told to call with further questions. ______

## 2014-03-26 NOTE — Telephone Encounter (Signed)
Noted  

## 2014-12-29 ENCOUNTER — Encounter: Payer: Self-pay | Admitting: Neurology

## 2014-12-29 ENCOUNTER — Ambulatory Visit (INDEPENDENT_AMBULATORY_CARE_PROVIDER_SITE_OTHER): Payer: 59 | Admitting: Neurology

## 2014-12-29 ENCOUNTER — Ambulatory Visit: Payer: 59 | Admitting: Neurology

## 2014-12-29 VITALS — BP 144/85 | HR 95 | Resp 18 | Ht 66.14 in | Wt 155.0 lb

## 2014-12-29 DIAGNOSIS — H5702 Anisocoria: Secondary | ICD-10-CM

## 2014-12-29 DIAGNOSIS — I671 Cerebral aneurysm, nonruptured: Secondary | ICD-10-CM

## 2014-12-29 NOTE — Patient Instructions (Signed)
Magnetic Resonance Imaging  Magnetic resonance imaging (MRI) is an imaging test that produces clear digital pictures of the inside of your body without using X-rays. The MRI scanner uses radio waves and a magnetic field to create the images. The MRI pictures may provide different details than images obtained through X-rays, CT scans, or ultrasounds. Contrast material may be injected to make MRI images even more clear. In a standard MRI scanner, the area of your body being studied will be in the center opening of the scanner. In open MRI scanners, the scanner does not entirely surround your body.   LET YOUR HEALTH CARE PROVIDER KNOW ABOUT:  · Previous surgeries you have had.  · Any metal you may have in your body. The magnet used in MRI can cause metal objects in your body to move. This includes:  ¨ A pacemaker or any other implants, such as an implanted neurostimulator, a metallic ear implant, or a metallic object within the eye socket.  ¨ Metal splinters in your body.  ¨ Any bullet fragments.  ¨ A port for delivering insulin or chemotherapy.  · Any tattoos. Some red dyes contain iron which is sometimes a problem.  · If you are pregnant or think you may be pregnant.  · If you are breastfeeding.  · If you are afraid of cramped spaces (claustrophobic). If claustrophobia is a problem, it usually can be relieved with medicines or the use of the open MRI scanner.  · Any allergies you have.  · All medicines you are taking, including vitamins, herbs, eye drops, creams, and over-the-counter medicines.  RISKS AND COMPLICATIONS   Generally, MRI is a safe procedure. However, problems can occur and include:  · If a metal implant is present but is undetected, it may be affected by the [place] magnetic field. In addition, if the implant is close to the examination site, it may be hard to get high-quality images.  · If you are pregnant:  ¨ MRI generally should be avoided during the first three months of pregnancy. It is not known  what effects the MRI may have on a fetus. Ultrasound is preferred at this time unless a serious condition is suspected that is best studied by MRI. MRI should be considered if there is a substantial risk of missing the correct diagnosis if MRI is not done.  · If you are breastfeeding:  ¨ You should inform your health care provider and ask how to proceed. You may pump breast milk before the exam for use until the contrast material, if used, has cleared from the body.  BEFORE THE PROCEDURE   · You will be asked to remove all metal, including:  ¨ Your watch, jewelry, and other metal objects.  ¨ Some makeup also contains traces of metal and may need to be removed.  ¨ Braces and fillings normally are not a problem.  PROCEDURE  · You may be given earplugs or headphones to listen to music. The MRI scanner can be noisy.  · You may be injected with contrast material.  · The standard MRI is done in a long, magnetic chamber. You will lie down on a platform that slides into the magnetic chamber. Once inside, you will still be able to talk to the person performing the test. The open MRI scanner is open on at least one side of the scanner.  · You will be asked to hold very still. You will be told when you can shift position. You may have to   wait a few minutes to make sure the images are readable.  AFTER THE PROCEDURE   · You may resume normal activities right away.  · If you were given contrast material, it will pass naturally through your body within a day.  · A person experienced in MRI (radiologist) will analyze the results and send a report to your health care provider, along with an explanation of the results.  Document Released: 08/04/2000 Document Revised: 12/22/2013 Document Reviewed: 10/02/2013  [employer]® Patient Information ©2015 [employer]. This information is not intended to replace advice given to you by your health care provider. Make sure you discuss any questions you have with your health care provider.

## 2014-12-29 NOTE — Progress Notes (Signed)
[facility]  Provider:  [name], M D  Referring Provider: Marden Noble, MD Primary Care Physician:  Pearla Dubonnet, MD  Chief Complaint  Patient presents with  . Follow-up    rm 10, alone    HPI:  Melanie Aguirre is a 54 y.o. female , who is seen here as a revisit from Dr. 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    Please review the preceding tests; no diagnosis has been made as to the paroxysmal nature of the diplopia.   Interval history :  In December 2014, she underwent a carotid doppler study , which failed to documented any injury to the carotid bulb , and could not explain the pupillary abnormality.   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  Dr Newell Coral performed the fusion surgery in January.  An EMG and NCS was normal in 2014.  This explains some of her dysesthesias. MRI normal ( coiling in place ) from 7-16- 2015 .  She now can carry some weight with her right hand again. She has recurrent tingling, and feels she healed well.    Interval history from 12-29-14.  [name] reports today that to her great surprise over the last 2 months she has had less of the spells that we have described in each of her previous visits. The used to come on every morning when she had to go to the bathroom there could up to 50 perhaps even 100 times a day now she goes some days without any spell at all and she no longer has these automatic triggering onset of bathroom break and spell. She is at a loss as to why this may have now changed and we have never found an explanation why In the first place.  She also had a second neck surgery in january when a 2 level fusion plate was removed and a extra plate 41 level fusion for cervical spinal stabilization was used. Him she had used a magnetic stimulating device to help her  bone growth. To this day she wonders if it may have been this device which she wore for about 6 weeks or so that could have started the episodes because this was a time when she was first diagnosed. 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 No strokes no MS no atrophy. The study was comparing images to 2-20 5-14.  She just returned from a vacation to the [place], her oldest daughter got married there. Her BP is fine, she lost 1 inch of height and gained some weight. Her cervical surgery improved her arm sensation, she has some strength loss in the right Arm and Hand grip. This was addressed with the second surgery.   Review of Systems: Out of a complete 14 system review, the patient complains of only the following symptoms, and all other reviewed systems are negative.  Improving right hand motor function.    History   Social History  . Marital Status: Married    Spouse Name: Melanie Aguirre  . Number of Children: 3  . Years of Education: BA3   Occupational History  . not employed    Social History Main Topics  . Smoking status: Never Smoker   . Smokeless tobacco:  Never Used  . Alcohol Use: Yes     Comment: 4-5 times a week  . Drug Use: No  . Sexual Activity: Not on file   Other Topics Concern  . Not on file   Social History Narrative   Patient lives at home husband Melanie RuizJohn.    Patient has 3 children.    Patient is retired.    Patient has a college degree.    Patient is right-handed.   Patient drinks 2-3 cups of coffee and tea sometimes but not everyday.    Family History  Problem Relation Age of Onset  . Hypertension Father   . Congestive Heart Failure Maternal Grandfather   . Cancer Paternal Grandmother     breast   . Cancer Paternal Grandfather     lung, throat    Past Medical History  Diagnosis Date  . Papilledema associated with decreased ocular pressure    . DJD (degenerative joint disease), cervical     history of fusion  . 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 Back spasm 1998    episodic severe low back, felt to have probably from lthe L5-S1 area. Usually responds to oxycodone one or two doses.  . Anxiety     last 4 months   . Intracerebral aneurysm   . 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 Anemia     as a teenager  . Eczema   . Family history of anesthesia complication     daughter has bad nausea and vomiting    Past Surgical History  Procedure Laterality Date  . Cervical spine surgery  04/2010    Dr Lauralyn PrimesNudleman, Robert  . Intracranial aneurysm repair  09/2012    [facility]  . Breast surgery Right     biopsy (negative)  . Anterior cervical decomp/discectomy fusion N/A 09/04/2013    Procedure: ANTERIOR CERVICAL DECOMPRESSION/DISCECTOMY FUSION 1 LEVEL/HARDWARE REMOVAL;  Surgeon: Hewitt Shortsobert W Nudelman, MD;  Location: [facility];  Service: Neurosurgery;  Laterality: N/A;  Hardware Removal Tether Plate [id number]     Vitals: BP 144/85 mmHg  Pulse 95  Resp 18  Ht 5' 6.14" (1.68 m)  Wt 155 lb (70.308 kg)  BMI 24.91 kg/m2 Last Weight:  Wt Readings from Last 1 Encounters:  12/29/14 155 lb (70.308 kg)   Last Height:   Ht Readings from Last 1 Encounters:  12/29/14 5' 6.14" (1.68 m)    Chief Complaint  Patient presents with  . Follow-up    rm 10, alone   Interval history- and exam .   The patient has noted more frequent this normally a poorly regulated BP, her spells have continued they involve still pupillary dilation and an occipital motor or abnormality. Except for the diplopia her vision is not changing during the spells. He affected eye is the left pulmonary she did pretty well on her visual testing here missed only one line from 20/20 on the left eye.  Had repeated  angiography last July this good report and is awaiting a follow up MRI in some of the next year.  The pain movements are associated visit pupillary dilation in the left eye ended with an up and in phenomena - diplopia is worse at distance, she is fairly unaffected when reading. When looking up and to the right causes the most interference.   corrective steps are for  the patient to look down and out which limits the diplopia interference rather than medial and up vision.   Spells occur up to 50 times a day. She has spells of sudden high blood pressure.   The dilation of the pupil,and the anisocoria at baseline are speaking for an autonomic nerve  injury , likely location along the retinal artery or artery ganglion. Direct light causes constriction as is consensual constriction, but the left pupil relaxes and dilates with both maneuvers while the right stays constricted.  That is a possible fatigue response?  There is normal EOM,  No ptosis today- Smooth per suit and normal  saccades. She notices the eye deviation with movement- with her early AM trip to the bathroom.   General: The patient is awake, alert and appears not in acute distress. The patient is well groomed. Head: Normocephalic, atraumatic.  Neck is rigid due to fusion- . Mallampati 2  neck circumference: 15.5 Cardiovascular:  Regular rate and rhythm , without  murmurs or carotid bruit, and without distended neck veins. Respiratory: Lungs are clear to auscultation. Skin:  Without evidence of edema, or rash Trunk: BMI is elevated and patient has a normal posture.  Neurologic exam : Speech is fluent without dysarthria, dysphonia or aphasia.  Mood and affect are appropriate.  Cranial nerves:  2 mm enlarged left pupil, 20/20 vision,   No ptosis today and no deviation.  Hearing to finger rub intact.  Facial sensation intact to fine touch.  Facial motor strength is symmetric and tongue and uvula move midline.  Motor exam:  Normal  tone, muscle bulk and symmetric normal strength in all extremities.  Her initial right had grip strength is intact but she cannot maintain, pronator weakness in the right arm.  Sensory:  Fine touch, pinprick and vibration were tested in all extremities.  Proprioception is tested in the upper extremities only. This was  normal.  Coordination: Rapid alternating movements in the fingers/hands is normal.  Finger-to-nose maneuver tested and normal without evidence of ataxia, dysmetria or tremor.  Gait and station: Patient walks without assistive device . Deep tendon reflexes: in the upper and lower extremities are symmetric and intact.  Babinski maneuver response is downgoing.   Assessment:  After physical and neurologic examination, review of laboratory studies, imaging, neurophysiology testing and pre-existing records, assessment is:   Eye deviation and dilation of the pupil on the left - not seen today . The patient feels a spasm in the eye. I  can not explain these symptoms, but they were  objectively present.  The symptoms have improved and again I lack an explanation for it but  i am happy for her. Last year she had reported a tinnitus in her left ear this is still present I do wonder if it may be related to the higher aspirin and antiplatelet and take at the time. Given that she has to have her yearly follow-up on her coiling I would not like her at this time to take a baby aspirin daily. At this time I don't think she needs any antiplatelet therapy. She had a normal carotid study 2015.   Plan:  Treatment plan and additional workup will be reviewed under Problem List.  Yearly RV  for MRA and MRA,  follow up on coiling .      Plan : MRI with highest TESLA  Available to be performed yearly.   MRA yearly to follow up on aneurysm. patiet had an aneurysm coiled in 2013.  Midbrain  and ophtalmic  / oculomotor pathway to be visualized .  with and without gadolinium . BP variation remains  unexplained.

## 2015-01-05 IMAGING — CT CT CERVICAL SPINE W/O CM
3 of 4 series · 13 of 33 positions shown, 16 images · non-contrast
Comparison: MRI yesterday.

CLINICAL DATA: New onset right arm pain.  Assess union at C5-6.

EXAM:
CT CERVICAL SPINE WITHOUT CONTRAST
TECHNIQUE: Multidetector CT imaging of the cervical spine was performed without
intravenous contrast. Multiplanar CT image reconstructions were also
generated.

[Series 4: coronal bone · coronal · 0.26mm/px · 3 of 34 slices shown]
[im 7/34  bone]
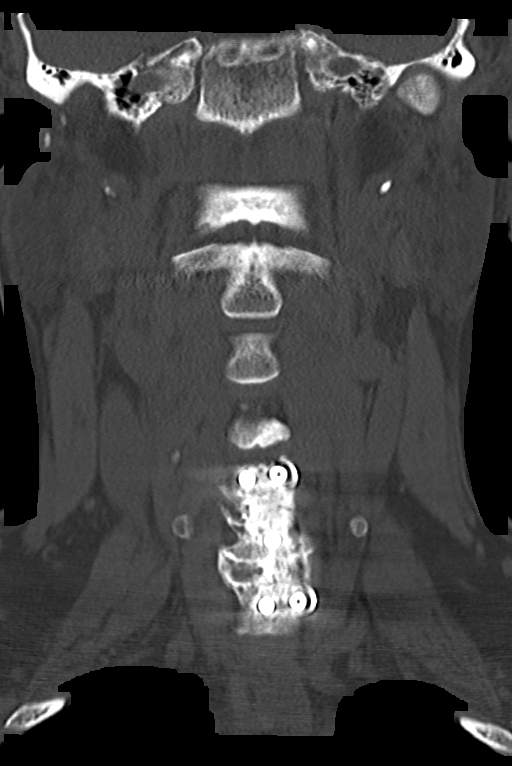
[im 14/34  bone]
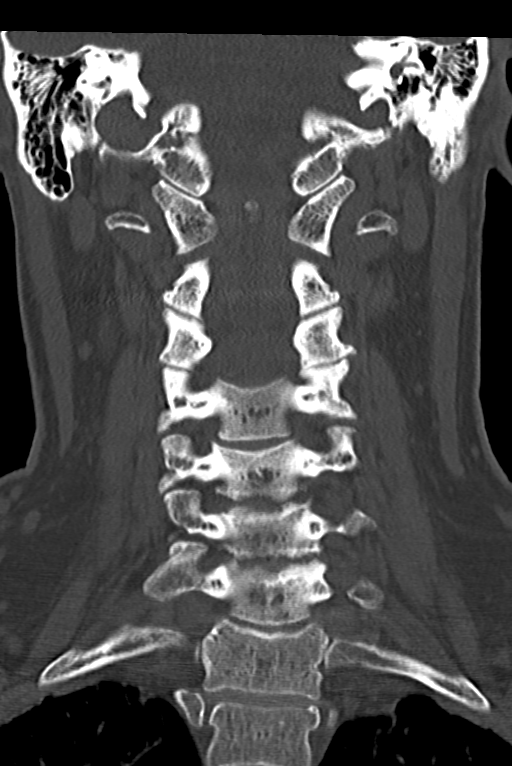
[im 20/34  bone]
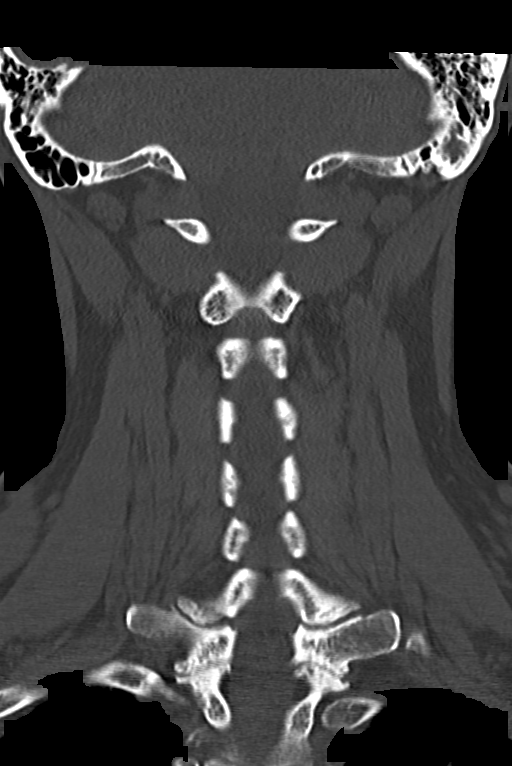

[Series 5: sagittal bone · sagittal · 0.28mm/px · 5 of 42 slices shown, 6 images]
[im 14/42  bone]
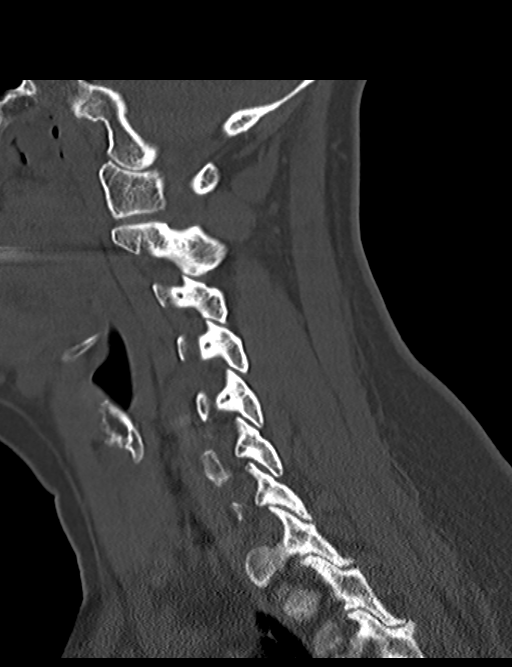
[im 18/42  bone]
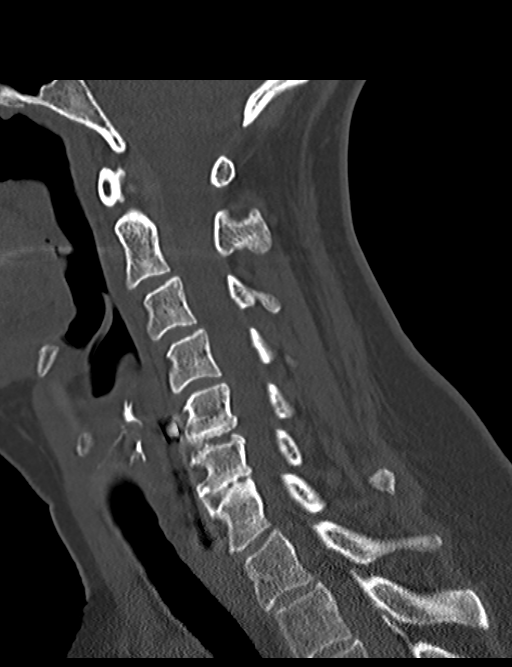
[im 21/42  soft-tissue]
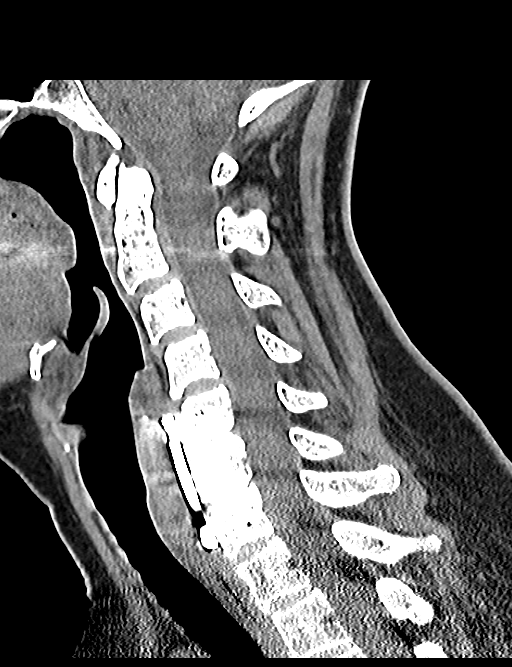
[im 21/42  bone]
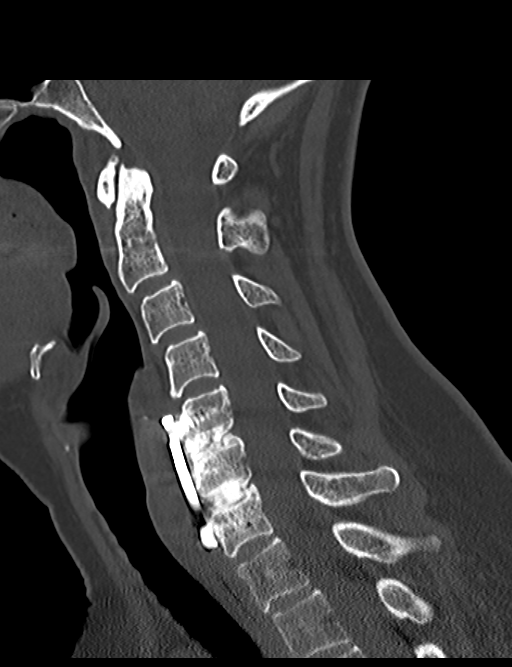
[im 24/42  bone]
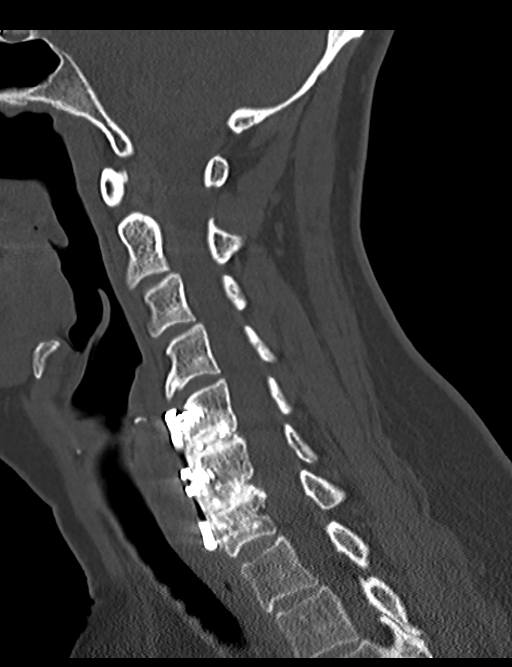
[im 28/42  bone]
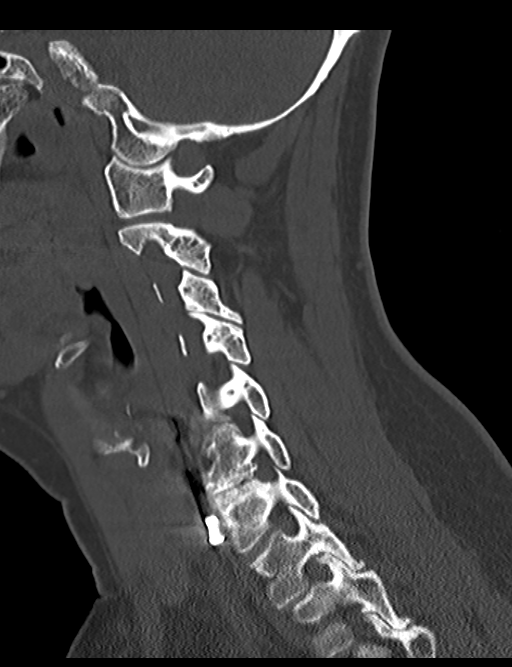

[Series 6: orthogonal bone · axial · 0.23mm/px · z∈[-209,-82]mm · 5 of 100 slices shown, 7 images]
[im 17/100  soft-tissue]
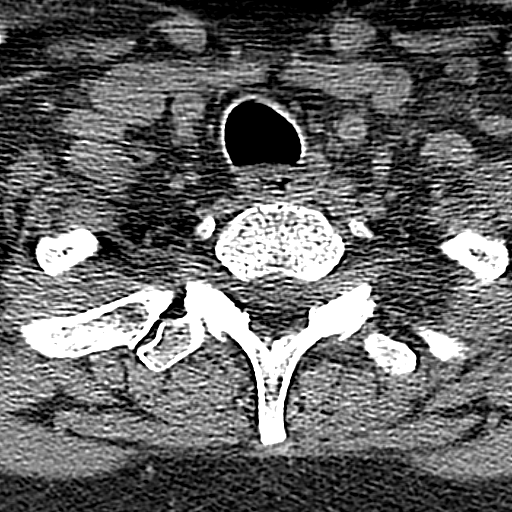
[im 17/100  bone]
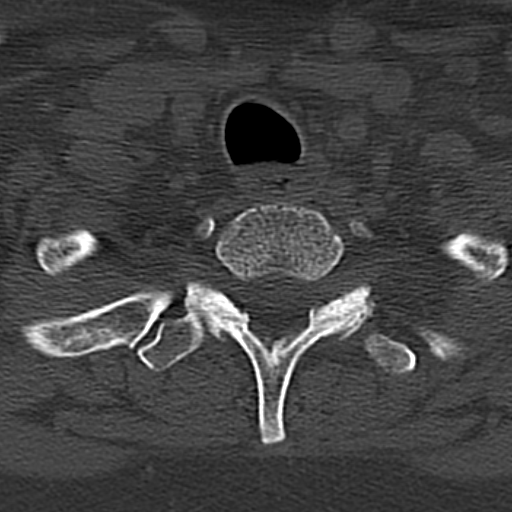
[im 34/100  bone]
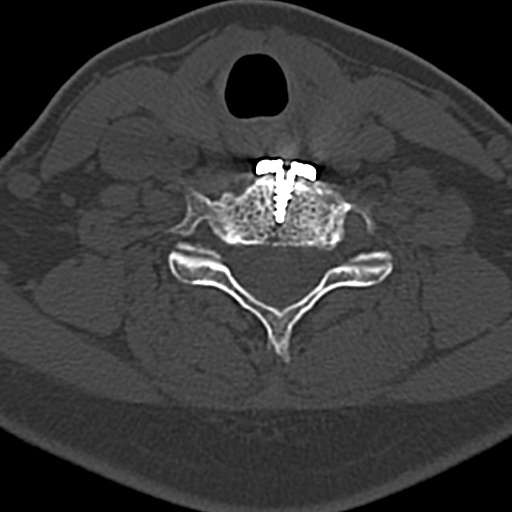
[im 50/100  bone]
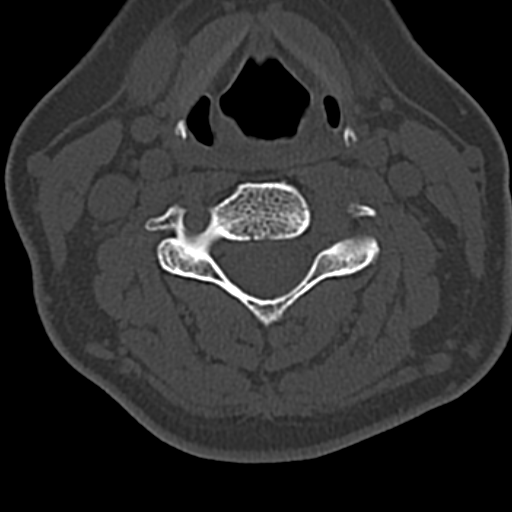
[im 67/100  bone]
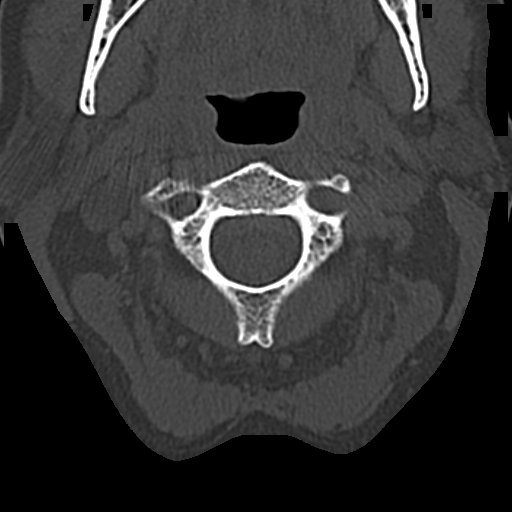
[im 83/100  soft-tissue]
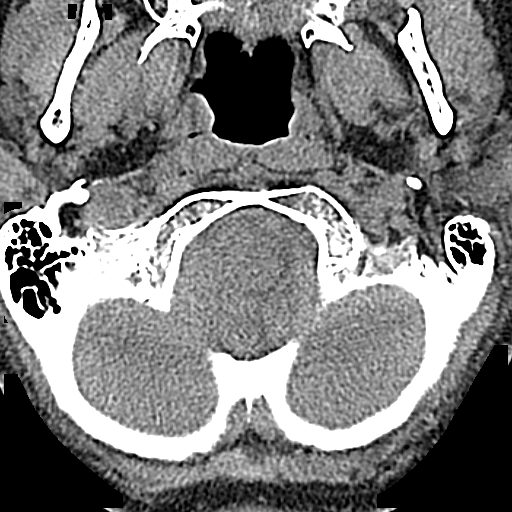
[im 83/100  bone]
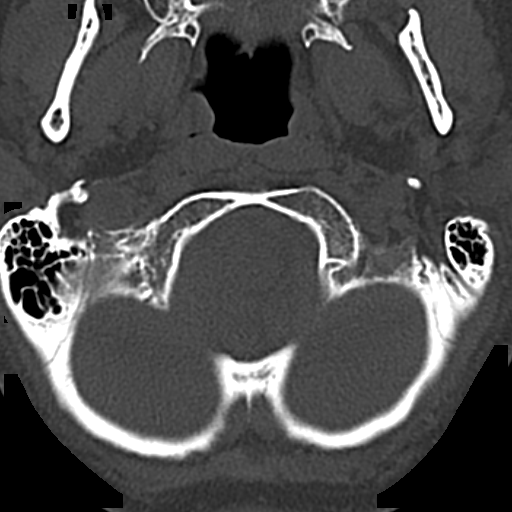

[13 of 33 positions shown; findings below may reference images not displayed]

FINDINGS: The foramen magnum is widely patent. There is mild osteoarthritis of
the C1-2 articulation.

C2-3:  Normal.

C3-4: Facet degeneration on the left with mild foraminal
encroachment on the left.

C4-5: Right posterior lateral disc herniation with foraminal
encroachment on the right.

C5 through C7: I believe there is solid union throughout the region.
Although one can appreciate the horizontal lucency visible at chest
radiography, there are areas where there appear to be clear solid
bony union. There is no lucency around any of the screws. The canal
and foramina are widely patent.

C7-T1:  Minimal facet degeneration.  No stenosis.
IMPRESSION: I believe there is solid bony union from C5 through C7. See above
discussion.

Right posterior lateral disc herniation at C4-5 with right foraminal
encroachment.

## 2015-01-27 ENCOUNTER — Other Ambulatory Visit: Payer: Self-pay

## 2015-01-27 DIAGNOSIS — Z1231 Encounter for screening mammogram for malignant neoplasm of breast: Secondary | ICD-10-CM

## 2015-03-01 ENCOUNTER — Ambulatory Visit
Admission: RE | Admit: 2015-03-01 | Discharge: 2015-03-01 | Disposition: A | Discharge status: Home / Self Care | Payer: 59 | Source: Ambulatory Visit | Attending: Neurology | Admitting: Neurology

## 2015-03-01 DIAGNOSIS — I671 Cerebral aneurysm, nonruptured: Secondary | ICD-10-CM

## 2015-03-01 DIAGNOSIS — H5702 Anisocoria: Secondary | ICD-10-CM

## 2015-03-01 MED ORDER — GADOBENATE DIMEGLUMINE 529 MG/ML IV SOLN
14.0000 mL | Freq: Once | INTRAVENOUS | Status: AC | PRN
  Administered 2015-03-01: 14 mL via INTRAVENOUS

## 2015-03-04 ENCOUNTER — Ambulatory Visit
Admission: RE | Admit: 2015-03-04 | Discharge: 2015-03-04 | Disposition: A | Discharge status: Home / Self Care | Payer: 59 | Source: Ambulatory Visit

## 2015-03-04 DIAGNOSIS — Z1231 Encounter for screening mammogram for malignant neoplasm of breast: Secondary | ICD-10-CM

## 2015-03-05 ENCOUNTER — Other Ambulatory Visit: Payer: Self-pay | Admitting: Gynecology

## 2015-03-05 DIAGNOSIS — M8580 Other specified disorders of bone density and structure, unspecified site: Secondary | ICD-10-CM

## 2015-03-08 ENCOUNTER — Ambulatory Visit
Admission: RE | Admit: 2015-03-08 | Discharge: 2015-03-08 | Disposition: A | Discharge status: Home / Self Care | Payer: 59 | Source: Ambulatory Visit | Attending: Gynecology | Admitting: Gynecology

## 2015-03-08 DIAGNOSIS — M8580 Other specified disorders of bone density and structure, unspecified site: Secondary | ICD-10-CM

## 2015-03-09 ENCOUNTER — Other Ambulatory Visit: Payer: Self-pay | Admitting: Gynecology

## 2015-03-09 DIAGNOSIS — M8580 Other specified disorders of bone density and structure, unspecified site: Secondary | ICD-10-CM

## 2015-03-29 ENCOUNTER — Telehealth: Payer: Self-pay

## 2015-03-29 NOTE — Telephone Encounter (Signed)
-----   Message from [name], MD sent at 03/29/2015 10:04 AM EDT ----- Mrs Sanderson's MRIs are  perfomed  by [facility] radiology-  ( Dr Antionette Poles ) see order . Dr Epimenio Foot reported the study as normal.

## 2015-03-29 NOTE — Telephone Encounter (Signed)
Called pt to tell her that MRI brain was normal. Pt complained that she saw on mychart the results of her MRA head which show a small 1.5-36mm aneurysm but no one has called her about this from our office yet and it was done on 03/01/2015. She took it upon herself to contact a radiologist to make sure it was read correctly. She wants to make sure Dr. Vickey Huger has seen this result and wants [place] to call her back tomorrow.

## 2015-03-30 NOTE — Telephone Encounter (Signed)
Dr. Vickey Huger advised me that she spoke to the pt last night. Dr. Vickey Huger is requesting that I call Dr. Benard Rink at [facility] for an overread of the MRA head resulted on 03/01/2015 and read by Dr. Epimenio Foot. I called [facility] and spoke to Dr. Benard Rink, who advised me that he would be able to overread the pt's MRA head from 03/01/2015 today. I gave him our clinic phone number to call me back if needed.

## 2015-03-31 ENCOUNTER — Telehealth: Payer: Self-pay | Admitting: Neurology

## 2015-03-31 NOTE — Telephone Encounter (Signed)
Mia, from [facility], called and said that before overreading the MRA Head, study dated 03/01/2015, Dr. Benard Rink would need to speak to Dr. Vickey Huger. I asked if he needed our number, and was informed that he had the number, and would call to speak with Dr. Vickey Huger.

## 2015-03-31 NOTE — Telephone Encounter (Signed)
Glad to hear he is willing to over read as a neutral third reader.  Please advise him of my cell phone to text me at [phone].  I will forward this directly to Dr, Benard Rink.

## 2015-03-31 NOTE — Telephone Encounter (Signed)
I have spoken on Monday  to Melanie Aguirre and excused for the way and the delay in which she received the results of this MRI, MRA.  I left a voicemail today , at her listed home phone number.   The 2014 study revealed a possible 1 mm aneurysm, this is now verified and risen to 1.5 mm, still a very small size, and not at danger of compressing neuronal structures , unlikely to bleed. Understandably,  Melanie Aguirre has been upset about the finding of an aneurysm. I have asked Dr Benard Rink to over-read the study as well as compare it to the 2014 study and give her a final result. She was told that there was no aneurysm , when a friend overlooked the MRI upon her request.    CD

## 2015-04-01 ENCOUNTER — Telehealth: Payer: Self-pay | Admitting: Neurology

## 2015-04-01 NOTE — Telephone Encounter (Signed)
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 All three came to this conclusion.  They also stated that the size in unchanged, not increased.   [name], MD

## 2015-04-05 ENCOUNTER — Telehealth: Payer: Self-pay | Admitting: Neurology

## 2015-04-05 NOTE — Telephone Encounter (Signed)
I am unable to addend the report in EPIC, for this reason I wrote a phone note documentation. The review by two neuroradiologist and an interventional radiologist  Let to the result of a coiled or looped vessel creating the image of an aneurysm . The patient was informed about the result by phone . She was greatly relieved .  From now on all imaging studies will be performed and interpreted by [facility]. CD

## 2015-04-06 ENCOUNTER — Telehealth: Payer: Self-pay | Admitting: Neurology

## 2015-04-06 NOTE — Telephone Encounter (Signed)
I spoke with Dr. Benard Rink by phone at 11. 50 a.m. this morning. Melanie Aguirre would like an addendum to her neuroimaging report stating that the reported aneurysm is reflecting not an aneurysm but a coiled or curved vessel. The [place] 2014 report by Augusto Gamble in regards to a similar study mentions a possible aneurysm at the same location and was the same size. There has been no addendum to that study either.  Dr. Lacy Duverney has asked to speak to Dr. Sherol Dade directly about a possible addendum to his report and he will talk to Dr. Margo Aye about an addendum to the radiology report. CD

## 2015-04-06 NOTE — Telephone Encounter (Signed)
Dr Benard Rink called requesting Dr Dohmeier to call at [phone]

## 2015-04-06 NOTE — Telephone Encounter (Signed)
error 

## 2015-04-08 NOTE — Progress Notes (Signed)
Addendum was discussed with pt on 8/15 by Dr. Vickey Huger.

## 2016-01-19 ENCOUNTER — Encounter: Payer: Self-pay | Admitting: Neurology

## 2016-01-19 ENCOUNTER — Ambulatory Visit (INDEPENDENT_AMBULATORY_CARE_PROVIDER_SITE_OTHER): Payer: 59 | Admitting: Neurology

## 2016-01-19 VITALS — BP 144/94 | HR 86 | Resp 20 | Ht 66.0 in | Wt 161.5 lb

## 2016-01-19 DIAGNOSIS — I671 Cerebral aneurysm, nonruptured: Secondary | ICD-10-CM | POA: Diagnosis not present

## 2016-01-19 NOTE — Progress Notes (Signed)
[facility]  Provider:  [name], M D  Referring Provider: Marden Noble, MD Primary Care Physician:  Pearla Dubonnet, MD  Chief Complaint  Patient presents with  . Follow-up    no complaints, yearly follow up    HPI:  Melanie Aguirre is a 55 y.o. female , who is seen here as a revisit from Dr. 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    Please review the preceding tests; no diagnosis has been made as to the paroxysmal nature of the diplopia.    12-29-14.Melanie Aguirre reports today that to her great surprise over the last 2 months she has had less of the spells that we have described in each of her previous visits. The used to come on every morning when she had to go to the bathroom there could up to 50 perhaps even 100 times a day now she goes some days without any spell at all and she no longer has these automatic triggering onset of bathroom break and spell. She is at a loss as to why this may have now changed and we have never found an explanation why In the first place.  She also had a second neck surgery in January when a 2 level fusion plate was removed and a extra plate 41 level fusion for cervical spinal stabilization was used. Him she had used a magnetic stimulating device to help her bone growth. To this day she wonders if it may have been this device which she wore for about 6 weeks or so that could have started the episodes because this was a time when she was first diagnosed. 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  No strokes,  no MS, no atrophy, no scar tissue. The study was comparing images to 2-20 5-14.  She just returned from a vacation to the [place], her oldest daughter got married there. Her BP is fine, she lost 1 inch of height and gained some weight. Her cervical surgery improved her arm sensation, she  has some strength loss in the right Arm and Hand grip. This was addressed with the second surgery.  01-19-2016  No eye episode in the last 6 month. The diagnosis has still never been made. Her last MRI and MRA of the brain was also nondiagnostic. Since she has not had symptoms I will discuss with her today if he even should repeat any imaging studies and certainly any future imaging studies will be done through [facility] imaging. She had an aneurysm treated in [place], [place] in the year 2014. Her vascular surgeon has felt that she has a higher risk of developing future aneurysms and for this reason a yearly vascular imaging study should be obtained. This is was not clearly related to her ophthalmologic symptoms. [facility]    Review of Systems: Out of a complete 14 system review, the patient complains of only the following symptoms, and all other reviewed systems are negative.  Improving right hand motor function.  Less eye symptoms.    Social History   Social History  . Marital Status: Married    Spouse Name: Melanie Aguirre  . Number of Children: 3  . Years of Education: BA3   Occupational History  . not employed    Social History Main Topics  . Smoking status: Never Smoker   .  Smokeless tobacco: Never Used  . Alcohol Use: Yes     Comment: 4-5 times a week  . Drug Use: No  . Sexual Activity: Not on file   Other Topics Concern  . Not on file   Social History Narrative   Patient lives at home husband Melanie Aguirre.    Patient has 3 children.    Patient is retired.    Patient has a college degree.    Patient is right-handed.   Patient drinks 2-3 cups of coffee and tea sometimes but not everyday.    Family History  Problem Relation Age of Onset  . Hypertension Father   . Congestive Heart Failure Maternal Grandfather   . Cancer Paternal Grandmother     breast   . Cancer Paternal Grandfather     lung, throat    Past Medical History  Diagnosis  Date  . Papilledema associated with decreased ocular pressure   . DJD (degenerative joint disease), cervical     history of fusion  . 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 Back spasm 1998    episodic severe low back, felt to have probably from lthe L5-S1 area. Usually responds to oxycodone one or two doses.  . Anxiety     last 4 months   . Intracerebral aneurysm   . 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 Anemia     as a teenager  . Eczema   . Family history of anesthesia complication     daughter has bad nausea and vomiting    Past Surgical History  Procedure Laterality Date  . Cervical spine surgery  04/2010    Dr Lauralyn PrimesNudleman, Robert  . Intracranial aneurysm repair  09/2012    [facility]  . Breast surgery Right     biopsy (negative)  . Anterior cervical decomp/discectomy fusion N/A 09/04/2013    Procedure: ANTERIOR CERVICAL DECOMPRESSION/DISCECTOMY FUSION 1 LEVEL/HARDWARE REMOVAL;  Surgeon: Hewitt Shortsobert W Nudelman, MD;  Location: [facility];  Service: Neurosurgery;  Laterality: N/A;  Hardware Removal Tether Plate W1-0C4-5     Vitals: BP 144/94 mmHg  Pulse 86  Resp 20  Ht 5\' 6"  (1.676 m)  Wt 161 lb 8 oz (73.256 kg)  BMI 26.08 kg/m2 Last Weight:  Wt Readings from Last 1 Encounters:  01/19/16 161 lb 8 oz (73.256 kg)   Last Height:   Ht Readings from Last 1 Encounters:  01/19/16 5\' 6"  (1.676 m)    Chief Complaint  Patient presents with  . Follow-up    no complaints, yearly follow up   Interval history- and exam .   The patient has noted more frequent  BP spikes , her spells have not continued , they tapered over the last 6-8 month.  they involved still pupillary dilation and an occipital motor or abnormality. Except for the diplopia her vision is not changing during the spells. He affected eye is the left pulmonary  she did pretty well on her visual testing here missed only one line from 20/20 on the left eye.  Had repeated angiography last July this good report and is awaiting a follow up MRI in some of the next year.  The pain movements are associated visit pupillary dilation in the left eye ended with an up and in phenomena - diplopia is worse at distance, she is fairly unaffected when reading. When  looking up and to the right causes the most interference. Corrective steps are for the patient to look down and out which limits the diplopia interference rather than medial and up vision. Spells occured up to 50 times a day. The dilation of the pupil,and the anisocoria at baseline are speaking for an autonomic nerve  injury , likely location along the retinal artery or artery ganglion. Direct light causes constriction as is consensual constriction, but the left pupil relaxes and dilates with both maneuvers while the right stays constricted.  That is a possible fatigue response?  There is normal EOM,  No ptosis today- Smooth per suit and normal  saccades.She notices the eye deviation with movement- with her early AM trip to the bathroom.   General: The patient is awake, alert and appears not in acute distress. The patient is well groomed. Head: Normocephalic, atraumatic.  Neck is rigid due to fusion- . Mallampati 2  neck circumference: 15.5 Cardiovascular:  Regular rate and rhythm , without  murmurs or carotid bruit, and without distended neck veins. Respiratory: Lungs are clear to auscultation. Skin:  Without evidence of edema, or rash Trunk: BMI is elevated and patient has a normal posture.  Neurologic exam :Speech is fluent without dysarthria, dysphonia or aphasia.  Mood and affect are appropriate.  Cranial nerves:  2 mm enlarged left pupil, 20/20 vision,  No ptosis today and no deviation. Hearing to finger rub intact.  Facial sensation intact to fine touch. Facial motor strength is symmetric and tongue and  uvula move midline. Motor exam:  Normal tone, muscle bulk and symmetric normal strength in all extremities.  Her initial right had grip strength is intact but she cannot maintain, pronator weakness in the right arm. Sensory:  Fine touch, pinprick and vibration were tested in all extremities.  Proprioception is tested in the upper extremities only. This was  normal. Coordination: Rapid alternating movements in the fingers/hands is normal.  Finger-to-nose maneuver tested and normal without evidence of ataxia, dysmetria or tremor.  Assessment:  After physical and neurologic examination, review of laboratory studies, imaging, neurophysiology testing and pre-existing records, assessment is:   Eye deviation and dilation of the pupil on the left - not seen today . The patient feels a spasm in the eye. I  can not explain these symptoms, but they were  objectively present.  The symptoms have improved and again I lack an explanation for it but  i am happy for her. Last year she had reported a tinnitus in her left ear this is still present I do wonder if it may be related to the higher aspirin and antiplatelet and take at the time. Given that she has to have her yearly follow-up on her coiling I would not like her at this time to take a baby aspirin daily. At this time I don't think she needs any antiplatelet therapy. She had a normal carotid study 2015.    Plan:  Treatment plan and additional workup will be reviewed under Problem List. Yearly RV for MRA and MRA,  follow up on coiling procedure and to rule out new aneurysm.    Plan :  MRA yearly to follow up on aneurysm. patiet had an aneurysm coiled in 2013 at [facility].  Midbrain and ophtalmic  / oculomotor pathway to be visualized .  with and without gadolinium . BP variation remains unexplained.   Dominika Losey, MD

## 2016-02-02 ENCOUNTER — Telehealth: Payer: Self-pay

## 2016-02-02 NOTE — Telephone Encounter (Signed)
-----   Message from Leanna Battlesanielle N White sent at 02/02/2016  2:42 PM EDT ----- Dr. Vickey Hugerohmeier, this patient MRA was approved but the MRI is pending P2P, she has an apt with [facility] tomorrow. Would you please call [phone] opt 3 Case number [id number] [phone]. Thanks!

## 2016-02-02 NOTE — Telephone Encounter (Signed)
MRI with and without contrast of the brain has been preauthorized code is [id number] [id number].

## 2016-02-02 NOTE — Telephone Encounter (Signed)
Dr. Vickey Hugerohmeier wanted me to make sure that Duwayne HeckDanielle knew this was preauthorized and to make sure that pt's MRI was read by [facility] radiologists.

## 2016-02-03 ENCOUNTER — Ambulatory Visit
Admission: RE | Admit: 2016-02-03 | Discharge: 2016-02-03 | Disposition: A | Discharge status: Home / Self Care | Payer: 59 | Source: Ambulatory Visit | Attending: Neurology | Admitting: Neurology

## 2016-02-03 DIAGNOSIS — I671 Cerebral aneurysm, nonruptured: Secondary | ICD-10-CM

## 2016-02-03 MED ORDER — GADOBENATE DIMEGLUMINE 529 MG/ML IV SOLN
15.0000 mL | Freq: Once | INTRAVENOUS | Status: AC | PRN
  Administered 2016-02-03: 15 mL via INTRAVENOUS

## 2016-02-07 ENCOUNTER — Telehealth: Payer: Self-pay

## 2016-02-07 NOTE — Telephone Encounter (Signed)
-----   Message from Melvyn Novasarmen Dohmeier, MD sent at 02/04/2016  1:47 PM EDT ----- Dear Mrs. Shinault,  As you can see, there has been a sinus mucous retention noticed. That's on the fringe , all the important stuff is as before.  No new aneurysm , no stroke, tumor and a plump and healthy looking brain. ! CD

## 2016-02-07 NOTE — Telephone Encounter (Signed)
I spoke to pt and advised her that per Dr. Vickey Hugerohmeier, there was sinus mucous retention noted in her MRI but that is "on the fringe". I advised her that no new aneurysm, no strokes, no tumors were noted, and her brain appeared healthy. Pt verbalized understanding of results of MRI/MRA. Pt says that she saw the results on mychart and reviewed them with her husband, who is a radiologist. I advised her that Dr. Vickey Hugerohmeier said that her MRA results were "same comment as MRI".  Pt had no questions at this time but was encouraged to call back if questions arise.

## 2016-03-06 ENCOUNTER — Other Ambulatory Visit: Payer: Self-pay | Admitting: Gynecology

## 2016-03-06 DIAGNOSIS — Z1231 Encounter for screening mammogram for malignant neoplasm of breast: Secondary | ICD-10-CM

## 2016-03-22 ENCOUNTER — Ambulatory Visit
Admission: RE | Admit: 2016-03-22 | Discharge: 2016-03-22 | Disposition: A | Discharge status: Home / Self Care | Payer: 59 | Source: Ambulatory Visit | Attending: Gynecology | Admitting: Gynecology

## 2016-03-22 DIAGNOSIS — Z1231 Encounter for screening mammogram for malignant neoplasm of breast: Secondary | ICD-10-CM

## 2016-10-03 DIAGNOSIS — D225 Melanocytic nevi of trunk: Secondary | ICD-10-CM | POA: Diagnosis not present

## 2016-10-03 DIAGNOSIS — D1801 Hemangioma of skin and subcutaneous tissue: Secondary | ICD-10-CM | POA: Diagnosis not present

## 2016-10-03 DIAGNOSIS — D2271 Melanocytic nevi of right lower limb, including hip: Secondary | ICD-10-CM | POA: Diagnosis not present

## 2016-10-03 DIAGNOSIS — L821 Other seborrheic keratosis: Secondary | ICD-10-CM | POA: Diagnosis not present

## 2016-11-27 DIAGNOSIS — H40053 Ocular hypertension, bilateral: Secondary | ICD-10-CM | POA: Diagnosis not present

## 2016-12-01 DIAGNOSIS — H524 Presbyopia: Secondary | ICD-10-CM | POA: Diagnosis not present

## 2016-12-01 DIAGNOSIS — H40053 Ocular hypertension, bilateral: Secondary | ICD-10-CM | POA: Diagnosis not present

## 2017-01-22 ENCOUNTER — Encounter: Payer: Self-pay | Admitting: Neurology

## 2017-01-22 ENCOUNTER — Ambulatory Visit (INDEPENDENT_AMBULATORY_CARE_PROVIDER_SITE_OTHER): Payer: BLUE CROSS/BLUE SHIELD | Admitting: Neurology

## 2017-01-22 VITALS — BP 157/89 | HR 78 | Resp 20 | Ht 66.0 in | Wt 164.0 lb

## 2017-01-22 DIAGNOSIS — I671 Cerebral aneurysm, nonruptured: Secondary | ICD-10-CM

## 2017-01-22 NOTE — Progress Notes (Signed)
[facility]  Provider:  [name], M D  Referring Provider: Marden Noble, MD Primary Care Physician:  Marden Noble, MD  Chief Complaint  Patient presents with  . Follow-up    things going well    HPI:  Melanie Aguirre is a 56 y.o. female , who is seen here as a revisit from Dr. 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    Please review the preceding tests; no diagnosis has been made as to the paroxysmal nature of the diplopia.    12-29-14.Melanie Aguirre reports today that to her great surprise over the last 2 months she has had less of the spells that we have described in each of her previous visits. The used to come on every morning when she had to go to the bathroom there could up to 50 perhaps even 100 times a day now she goes some days without any spell at all and she no longer has these automatic triggering onset of bathroom break and spell. She is at a loss as to why this may have now changed and we have never found an explanation why In the first place.  She also had a second neck surgery in January when a 2 level fusion plate was removed and a extra plate 41 level fusion for cervical spinal stabilization was used. Him she had used a magnetic stimulating device to help her bone growth. To this day she wonders if it may have been this device which she wore for about 6 weeks or so that could have started the episodes because this was a time when she was first diagnosed. 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  No strokes,  no MS, no atrophy, no scar tissue. The study was comparing images to 2-20 5-14.  She just returned from a vacation to the [place], her oldest daughter got married there. Her BP is fine, she lost 1 inch of height and gained some weight. Her cervical surgery improved her arm sensation, she has some strength  loss in the right Arm and Hand grip. This was addressed with the second surgery.  01-19-2016  No eye episode in the last 6 month. The diagnosis has still never been made. Her last MRI and MRA of the brain was also nondiagnostic. Since she has not had symptoms I will discuss with her today if he even should repeat any imaging studies and certainly any future imaging studies will be done through [facility] imaging. She had an aneurysm treated in [place], [place] in the year 2014. Her vascular surgeon has felt that she has a higher risk of developing future aneurysms and for this reason a yearly vascular imaging study should be obtained. This is was not clearly related to her ophthalmologic symptoms. [name].   01-22-2017, Status post repeat cervical fusion, 3 of 7 vertebrae are surgically fused. She has some questions about her ability to participate in yoga. I do not have reservations. She will need her yearly MRI- brain and to be read by [facility].      Review of Systems: Out of a complete 14 system review, the patient complains of only the following symptoms, and all other reviewed systems are negative.  Improving right hand motor function.  Less eye symptoms- no spell for a year.    Social History  Social History  . Marital status: Married    Spouse name: [name]  . Number of children: 3  . Years of education: BA3   Occupational History  . not employed    Social History Main Topics  . Smoking status: Never Smoker  . Smokeless tobacco: Never Used  . Alcohol use Yes     Comment: 4-5 times a week  . Drug use: No  . Sexual activity: Not on file   Other Topics Concern  . Not on file   Social History Narrative   Patient lives at home husband [name].    Patient has 3 children.    Patient is retired.    Patient has a college degree.    Patient is right-handed.   Patient drinks 2-3 cups of coffee and tea sometimes but not everyday.     Family History  Problem Relation Age of Onset  . Hypertension Father   . Congestive Heart Failure Maternal Grandfather   . Cancer Paternal Grandmother        breast   . Cancer Paternal Grandfather        lung, throat    Past Medical History:  Diagnosis Date  . Anemia    as a teenager  . Anxiety    last 4 months   . Back spasm 1998   episodic severe low back, felt to have probably from lthe L5-S1 area. Usually responds to oxycodone one or two doses.  . 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 DJD (degenerative joint disease), cervical    history of fusion  . Eczema   . Family history of anesthesia complication    daughter has bad nausea and vomiting  . Intracerebral aneurysm   . Papilledema associated with decreased ocular pressure   . 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 ANTERIOR CERVICAL DECOMP/DISCECTOMY FUSION N/A 09/04/2013   Procedure: ANTERIOR CERVICAL DECOMPRESSION/DISCECTOMY FUSION 1 LEVEL/HARDWARE REMOVAL;  Surgeon: Hewitt Shorts, MD;  Location: [facility];  Service: Neurosurgery;  Laterality: N/A;  Hardware Removal Tether Plate U9-8  . BREAST SURGERY Right    biopsy (negative)  . CERVICAL SPINE SURGERY  04/2010   Dr Lauralyn Primes  . INTRACRANIAL ANEURYSM REPAIR  09/2012   coiled-[facility]     Vitals: BP (!) 157/89   Pulse 78   Resp 20   Ht 5\' 6"  (1.676 m)   Wt 164 lb (74.4 kg)   BMI 26.47 kg/m  Last Weight:  Wt Readings from Last 1 Encounters:  01/22/17 164 lb (74.4 kg)   Last Height:   Ht Readings from Last 1 Encounters:  01/22/17 5\' 6"  (1.676 m)    Chief Complaint  Patient presents with  . Follow-up    things going well   Interval history- and exam . General: The patient is awake, alert and appears not in acute distress.  The patient is well groomed. Head: Normocephalic, atraumatic. Neck is rigid due to fusion- . Mallampati 2  neck circumference: 15.5. Cardiovascular:  Regular rate and rhythm , without  murmurs or carotid bruit, and without distended neck veins.Respiratory: Lungs are clear to auscultation.Skin:  Without evidence of edema, or rash. Trunk: BMI is elevated and patient has a normal posture. Neurologic exam :Speech is fluent without dysarthria, dysphonia or aphasia. Mood and affect  are appropriate. Cranial nerves: 2 mm enlarged left pupil, 20/20 vision,  No ptosis today and no deviation.  Facial sensation intact to fine touch. Facial motor strength is symmetric. Tongue and uvula move midline. Motor exam:  Normal tone, muscle bulk and symmetric normal strength in all extremities.  Her initial right had grip strength is intact but she cannot maintain, pronator weakness in the right arm. Finger-to-nose maneuver tested and normal without evidence of ataxia, dysmetria or tremor.  Assessment:  After physical and neurologic examination, review of laboratory studies, imaging, neurophysiology testing and pre-existing records, assessment is:   Eye deviation and dilation of the pupil on the left - not seen today . The patient feels a spasm in the eye. I  can not explain these symptoms, but they were  objectively present.  The symptoms have improved and again I lack an explanation for it but  i am happy for her. Last year she had reported a tinnitus in her left ear this is still present I do wonder if it may be related to the higher aspirin and antiplatelet and take at the time. Given that she has to have her yearly follow-up on her coiling I would not like her at this time to take a baby aspirin daily. At this time I don't think she needs any antiplatelet therapy. She had a normal carotid study 2015.    Plan:  Treatment plan and additional workup will be reviewed under Problem List. Yearly RV for MRA and MRA,   follow up on coiling procedure and to rule out new aneurysm.    Plan : MRA yearly to follow up on aneurysm. patient had an [facility].  Midbrain and ophtalmic  / oculomotor pathway to be visualized with and without gadolinium .   Melvyn Novasarmen Kellee Sittner, MD

## 2017-01-29 ENCOUNTER — Telehealth: Payer: Self-pay | Admitting: Neurology

## 2017-01-29 ENCOUNTER — Other Ambulatory Visit: Payer: Self-pay | Admitting: Neurology

## 2017-01-29 DIAGNOSIS — G529 Cranial nerve disorder, unspecified: Secondary | ICD-10-CM

## 2017-01-29 NOTE — Telephone Encounter (Signed)
Irving BurtonEmily,  I placed the order MRA head without

## 2017-01-29 NOTE — Telephone Encounter (Signed)
This is a Dr. Vickey Hugerohmeier patient Melanie Aguirre with [facility] just called and informed me they only do a MRA Head wo contrast.. When you get a chance can you switch the order to MRA head wo contrast instead of the MRA Head w/wo contrast..

## 2017-01-29 NOTE — Telephone Encounter (Signed)
Noted thank you

## 2017-03-01 ENCOUNTER — Ambulatory Visit
Admission: RE | Admit: 2017-03-01 | Discharge: 2017-03-01 | Disposition: A | Discharge status: Home / Self Care | Payer: BLUE CROSS/BLUE SHIELD | Source: Ambulatory Visit | Attending: Neurology | Admitting: Neurology

## 2017-03-01 ENCOUNTER — Other Ambulatory Visit: Payer: Self-pay | Admitting: Gynecology

## 2017-03-01 DIAGNOSIS — Z1231 Encounter for screening mammogram for malignant neoplasm of breast: Secondary | ICD-10-CM

## 2017-03-01 DIAGNOSIS — I671 Cerebral aneurysm, nonruptured: Secondary | ICD-10-CM

## 2017-03-01 DIAGNOSIS — G529 Cranial nerve disorder, unspecified: Secondary | ICD-10-CM

## 2017-03-01 MED ORDER — GADOBENATE DIMEGLUMINE 529 MG/ML IV SOLN
15.0000 mL | Freq: Once | INTRAVENOUS | Status: AC | PRN
  Administered 2017-03-01: 15 mL via INTRAVENOUS

## 2017-03-27 ENCOUNTER — Ambulatory Visit
Admission: RE | Admit: 2017-03-27 | Discharge: 2017-03-27 | Disposition: A | Discharge status: Home / Self Care | Payer: BLUE CROSS/BLUE SHIELD | Source: Ambulatory Visit | Attending: Gynecology | Admitting: Gynecology

## 2017-03-27 DIAGNOSIS — Z1231 Encounter for screening mammogram for malignant neoplasm of breast: Secondary | ICD-10-CM

## 2017-04-12 DIAGNOSIS — Z1211 Encounter for screening for malignant neoplasm of colon: Secondary | ICD-10-CM | POA: Diagnosis not present

## 2017-04-12 DIAGNOSIS — K635 Polyp of colon: Secondary | ICD-10-CM | POA: Diagnosis not present

## 2017-04-12 DIAGNOSIS — K5730 Diverticulosis of large intestine without perforation or abscess without bleeding: Secondary | ICD-10-CM | POA: Diagnosis not present

## 2017-04-16 DIAGNOSIS — K635 Polyp of colon: Secondary | ICD-10-CM | POA: Diagnosis not present

## 2017-05-21 DIAGNOSIS — H40053 Ocular hypertension, bilateral: Secondary | ICD-10-CM | POA: Diagnosis not present

## 2017-05-22 DIAGNOSIS — Z6826 Body mass index (BMI) 26.0-26.9, adult: Secondary | ICD-10-CM | POA: Diagnosis not present

## 2017-05-22 DIAGNOSIS — Z01419 Encounter for gynecological examination (general) (routine) without abnormal findings: Secondary | ICD-10-CM | POA: Diagnosis not present

## 2017-05-22 DIAGNOSIS — Z1389 Encounter for screening for other disorder: Secondary | ICD-10-CM | POA: Diagnosis not present

## 2017-05-22 DIAGNOSIS — Z130 Encounter for screening for diseases of the blood and blood-forming organs and certain disorders involving the immune mechanism: Secondary | ICD-10-CM | POA: Diagnosis not present

## 2017-05-22 DIAGNOSIS — Z780 Asymptomatic menopausal state: Secondary | ICD-10-CM | POA: Diagnosis not present

## 2017-06-04 NOTE — Telephone Encounter (Signed)
Close Encounter 

## 2017-07-09 DIAGNOSIS — Z23 Encounter for immunization: Secondary | ICD-10-CM | POA: Diagnosis not present

## 2017-07-09 DIAGNOSIS — M5012 Mid-cervical disc disorder, unspecified level: Secondary | ICD-10-CM | POA: Diagnosis not present

## 2017-07-09 DIAGNOSIS — E871 Hypo-osmolality and hyponatremia: Secondary | ICD-10-CM | POA: Diagnosis not present

## 2017-07-09 DIAGNOSIS — R635 Abnormal weight gain: Secondary | ICD-10-CM | POA: Diagnosis not present

## 2017-07-09 DIAGNOSIS — E559 Vitamin D deficiency, unspecified: Secondary | ICD-10-CM | POA: Diagnosis not present

## 2017-07-09 DIAGNOSIS — Z0000 Encounter for general adult medical examination without abnormal findings: Secondary | ICD-10-CM | POA: Diagnosis not present

## 2017-08-04 DIAGNOSIS — L820 Inflamed seborrheic keratosis: Secondary | ICD-10-CM | POA: Diagnosis not present

## 2017-11-12 DIAGNOSIS — M71341 Other bursal cyst, right hand: Secondary | ICD-10-CM | POA: Diagnosis not present

## 2017-11-12 DIAGNOSIS — M79645 Pain in left finger(s): Secondary | ICD-10-CM | POA: Diagnosis not present

## 2017-11-27 DIAGNOSIS — D2272 Melanocytic nevi of left lower limb, including hip: Secondary | ICD-10-CM | POA: Diagnosis not present

## 2017-11-27 DIAGNOSIS — L819 Disorder of pigmentation, unspecified: Secondary | ICD-10-CM | POA: Diagnosis not present

## 2017-11-27 DIAGNOSIS — D225 Melanocytic nevi of trunk: Secondary | ICD-10-CM | POA: Diagnosis not present

## 2017-11-27 DIAGNOSIS — L821 Other seborrheic keratosis: Secondary | ICD-10-CM | POA: Diagnosis not present

## 2017-11-27 DIAGNOSIS — L570 Actinic keratosis: Secondary | ICD-10-CM | POA: Diagnosis not present

## 2018-01-23 ENCOUNTER — Ambulatory Visit: Payer: BLUE CROSS/BLUE SHIELD | Admitting: Neurology

## 2018-01-23 ENCOUNTER — Encounter: Payer: Self-pay | Admitting: Neurology

## 2018-01-23 ENCOUNTER — Telehealth: Payer: Self-pay | Admitting: Neurology

## 2018-01-23 VITALS — BP 137/88 | HR 82 | Ht 66.0 in | Wt 161.0 lb

## 2018-01-23 DIAGNOSIS — Z8679 Personal history of other diseases of the circulatory system: Secondary | ICD-10-CM | POA: Insufficient documentation

## 2018-01-23 DIAGNOSIS — H5702 Anisocoria: Secondary | ICD-10-CM

## 2018-01-23 DIAGNOSIS — I671 Cerebral aneurysm, nonruptured: Secondary | ICD-10-CM | POA: Diagnosis not present

## 2018-01-23 DIAGNOSIS — R29818 Other symptoms and signs involving the nervous system: Secondary | ICD-10-CM

## 2018-01-23 DIAGNOSIS — Z9889 Other specified postprocedural states: Secondary | ICD-10-CM | POA: Diagnosis not present

## 2018-01-23 NOTE — Progress Notes (Signed)
[facility]  Provider:  Melvyn Novasarmen  Farrie Sann, M D  Referring Provider: Marden NobleGates, Robert, MD Primary Care Physician:  Marden NobleGates, Robert, MD  Chief Complaint  Patient presents with  . Follow-up    pt doing well, had a stye last week, feels tired today,pt is traveling to [place] today,  alone, rm 11    HPI:  Melanie Aguirre is a 57 y.o. female , who is seen here as a revisit from Dr. 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    Please review the preceding tests; no diagnosis has been made as to the paroxysmal nature of the diplopia.    12-29-14.Melanie Aguirre reports today that to her great surprise over the last 2 months she has had less of the spells that we have described in each of her previous visits. The used to come on every morning when she had to go to the bathroom there could up to 50 perhaps even 100 times a day now she goes some days without any spell at all and she no longer has these automatic triggering onset of bathroom break and spell. She is at a loss as to why this may have now changed and we have never found an explanation why In the first place.  She also had a second neck surgery in January when a 2 level fusion plate was removed and a extra plate 41 level fusion for cervical spinal stabilization was used. Him she had used a magnetic stimulating device to help her bone growth. To this day she wonders if it may have been this device which she wore for about 6 weeks or so that could have started the episodes because this was a time when she was first diagnosed. 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  No strokes,  no MS, no atrophy, no scar tissue. The study was comparing images to 2-20 5-14.  She just returned from a vacation to the [place], her oldest daughter got married there. Her BP is fine, she lost 1 inch of height and gained  some weight. Her cervical surgery improved her arm sensation, she has some strength loss in the right Arm and Hand grip. This was addressed with the second surgery.  01-19-2016  No eye episode in the last 6 month. The diagnosis has still never been made. Her last MRI and MRA of the brain was also nondiagnostic. Since she has not had symptoms I will discuss with her today if he even should repeat any imaging studies and certainly any future imaging studies will be done through [facility]. She had an aneurysm treated in [place] in the year 2014. Her vascular surgeon has felt that she has a higher risk of developing future aneurysms and for this reason a yearly vascular imaging study should be obtained. This is was not clearly related to her ophthalmologic symptoms. [facility].   01-22-2017, Status post repeat cervical fusion, 3 of 7 vertebrae are surgically fused. She has some questions about her ability to participate in yoga. I do not have reservations. She will need her yearly MRI- brain and to be read by [facility].   01-23-2018, Patient here for regular MRA, following he aneurysmata. To be read by [facility].     Review of Systems: Out of a complete 14 system review, the patient complains  of only the following symptoms, and all other reviewed systems are negative.  Improving right hand motor function.  Less eye symptoms- no spell for a year.    Social History   Socioeconomic History  . Marital status: Married    Spouse name: Melanie Aguirre  . Number of children: 3  . Years of education: BA3  . Highest education level: Not on file  Occupational History  . 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 Financial resource strain: Not on file  . Food insecurity:    Worry: Not on file    Inability: Not on file  . Transportation needs:    Medical: Not on file    Non-medical: Not on file  Tobacco Use  . Smoking status: Never Smoker  .  Smokeless tobacco: Never Used  Substance and Sexual Activity  . Alcohol use: Yes    Comment: 4-5 times a week  . Drug use: No  . Sexual activity: Not on file  Lifestyle  . Physical activity:    Days per week: Not on file    Minutes per session: Not on file  . Stress: Not on file  Relationships  . Social connections:    Talks on phone: Not on file    Gets together: Not on file    Attends religious service: Not on file    Active member of club or organization: Not on file    Attends meetings of clubs or organizations: Not on file    Relationship status: Not on file  . Intimate partner violence:    Fear of current or ex partner: Not on file    Emotionally abused: Not on file    Physically abused: Not on file    Forced sexual activity: Not on file  Other Topics Concern  . Not on file  Social History Narrative   Patient lives at home husband Melanie Aguirre.    Patient has 3 children.    Patient is retired.    Patient has a college degree.    Patient is right-handed.   Patient drinks 2-3 cups of coffee and tea sometimes but not everyday.    Family History  Problem Relation Age of Onset  . Hypertension Father   . Congestive Heart Failure Maternal Grandfather   . Cancer Paternal Grandmother        breast   . Breast cancer Paternal Grandmother   . Cancer Paternal Grandfather        lung, throat    Past Medical History:  Diagnosis Date  . Anemia    as a teenager  . Anxiety    last 4 months   . Back spasm 1998   episodic severe low back, felt to have probably from lthe L5-S1 area. Usually responds to oxycodone one or two doses.  . 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 DJD (degenerative joint disease), cervical    history of fusion  . Eczema   . Family history of anesthesia complication    daughter has bad nausea and vomiting  . Intracerebral aneurysm   . Papilledema associated with decreased ocular pressure   . 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 ANTERIOR CERVICAL DECOMP/DISCECTOMY FUSION N/A 09/04/2013   Procedure: ANTERIOR CERVICAL DECOMPRESSION/DISCECTOMY FUSION 1 LEVEL/HARDWARE REMOVAL;  Surgeon: Hewitt Shorts, MD;  Location: [facility]  NEURO ORS;  Service: Neurosurgery;  Laterality: N/A;  Hardware Removal Tether Plate V4-0  . BREAST BIOPSY Right   . BREAST SURGERY Right    biopsy (negative)  . CERVICAL SPINE SURGERY  04/2010   Dr Lauralyn Primes  . INTRACRANIAL ANEURYSM REPAIR  09/2012   coiled-Dr [facility]     Vitals: BP 137/88   Pulse 82   Ht 5\' 6"  (1.676 m)   Wt 161 lb (73 kg)   BMI 25.99 kg/m  Last Weight:  Wt Readings from Last 1 Encounters:  01/23/18 161 lb (73 kg)   Last Height:   Ht Readings from Last 1 Encounters:  01/23/18 5\' 6"  (1.676 m)    Chief Complaint  Patient presents with  . Follow-up    pt doing well, had a stye last week, feels tired today,pt is traveling to [place] today,  alone, rm 11   Interval history- and exam . General: The patient is awake, alert and appears not in acute distress. The patient is well groomed. Head: Normocephalic, atraumatic. Neck is rigid due to fusion- . Mallampati 2  neck circumference: 15.5.  Cardiovascular:  Regular rate and rhythm , without  murmurs or carotid bruit, and without distended neck veins. Respiratory: Lungs are clear to auscultation. Skin:  Without evidence of edema, or rash.  Trunk: BMI is elevated and patient has a normal posture. Neurologic exam : Speech is fluent without dysarthria, dysphonia or aphasia. Mood and affect are appropriate. Cranial nerves: 2 mm enlarged left pupil, 20/20 vision,   No ptosis today and no deviation.  Facial sensation intact to fine touch. Facial motor strength is symmetric. Tongue and uvula move midline. Motor exam:  Normal tone, muscle  bulk and symmetric normal strength in all extremities.  Her initial right had grip strength is intact but she cannot maintain, pronator weakness in the right arm. Finger-to-nose maneuver tested and normal without evidence of ataxia, dysmetria or tremor.  Assessment:  After physical and neurologic examination, review of laboratory studies, imaging, neurophysiology testing and pre-existing records, assessment is:   Eye deviation and dilation of the pupil on the left - not seen today . The patient feels a spasm in the eye. I  can not explain these symptoms, but they were  objectively present.  The symptoms have improved and again I lack an explanation for it but  i am happy for her. Last year she had reported a tinnitus in her left ear this is still present I do wonder if it may be related to the higher aspirin and antiplatelet and take at the time. Given that she has to have her yearly follow-up on her coiling I would not like her at this time to take a baby aspirin daily.  At this time I don't think she needs any antiplatelet therapy. She had a normal carotid study 2015.   Plan:  Treatment plan and additional workup will be reviewed under Problem List. Yearly RV for MRA and MRA,  follow up on coiling procedure and to rule out new aneurysm.    Plan : MRA yearly to follow up on aneurysm. patient had an aneurysm coiled in 2013 at [facility].  Midbrain and ophtalmic  / oculomotor pathway to be visualized with and without gadolinium .   [name], MD   01-23-2018  CC Dr. Kevan Ny.

## 2018-01-23 NOTE — Telephone Encounter (Signed)
[employer] [place] auth: NPR Ref # [id number] @ 3:18 PM order sent to [facility]. They will reach out to the patient to schedule

## 2018-01-28 DIAGNOSIS — H40053 Ocular hypertension, bilateral: Secondary | ICD-10-CM | POA: Diagnosis not present

## 2018-01-28 DIAGNOSIS — H1032 Unspecified acute conjunctivitis, left eye: Secondary | ICD-10-CM | POA: Diagnosis not present

## 2018-01-28 DIAGNOSIS — H0015 Chalazion left lower eyelid: Secondary | ICD-10-CM | POA: Diagnosis not present

## 2018-01-28 DIAGNOSIS — H0014 Chalazion left upper eyelid: Secondary | ICD-10-CM | POA: Diagnosis not present

## 2018-01-31 DIAGNOSIS — H40053 Ocular hypertension, bilateral: Secondary | ICD-10-CM | POA: Diagnosis not present

## 2018-01-31 DIAGNOSIS — H0015 Chalazion left lower eyelid: Secondary | ICD-10-CM | POA: Diagnosis not present

## 2018-01-31 DIAGNOSIS — H40013 Open angle with borderline findings, low risk, bilateral: Secondary | ICD-10-CM | POA: Diagnosis not present

## 2018-01-31 DIAGNOSIS — H10413 Chronic giant papillary conjunctivitis, bilateral: Secondary | ICD-10-CM | POA: Diagnosis not present

## 2018-03-08 ENCOUNTER — Other Ambulatory Visit: Payer: Self-pay | Admitting: Gynecology

## 2018-03-08 DIAGNOSIS — Z1231 Encounter for screening mammogram for malignant neoplasm of breast: Secondary | ICD-10-CM

## 2018-03-22 ENCOUNTER — Ambulatory Visit
Admission: RE | Admit: 2018-03-22 | Discharge: 2018-03-22 | Disposition: A | Discharge status: Home / Self Care | Payer: BLUE CROSS/BLUE SHIELD | Source: Ambulatory Visit | Attending: Neurology | Admitting: Neurology

## 2018-03-22 ENCOUNTER — Other Ambulatory Visit: Payer: Self-pay | Admitting: Neurology

## 2018-03-22 DIAGNOSIS — I671 Cerebral aneurysm, nonruptured: Secondary | ICD-10-CM | POA: Diagnosis not present

## 2018-03-22 DIAGNOSIS — R29818 Other symptoms and signs involving the nervous system: Secondary | ICD-10-CM

## 2018-03-22 MED ORDER — GADOBENATE DIMEGLUMINE 529 MG/ML IV SOLN
14.0000 mL | Freq: Once | INTRAVENOUS | Status: AC | PRN
  Administered 2018-03-22: 14 mL via INTRAVENOUS

## 2018-03-25 ENCOUNTER — Telehealth: Payer: Self-pay | Admitting: *Deleted

## 2018-03-25 NOTE — Telephone Encounter (Addendum)
Called pt and LVM (ok per DPR) informing pt that her MRI brain showed no change and no aneurysm. MRA head also showed same as last images, unchanged. Encouraged pt to call back if she has any questions. Left office number in message.    ----- Message from Melvyn Novasarmen Dohmeier, MD sent at 03/22/2018 12:03 PM EDT ----- No change - and no aneurysm !   Notes recorded by Melvyn Novasohmeier, Carmen, MD on 03/22/2018 at 12:04 PM EDT Same as last images, unchanged.  MR MRA HEAD WO CONTRAST

## 2018-03-28 ENCOUNTER — Other Ambulatory Visit: Payer: Self-pay | Admitting: Internal Medicine

## 2018-03-28 ENCOUNTER — Other Ambulatory Visit: Payer: Self-pay | Admitting: Gynecology

## 2018-03-28 DIAGNOSIS — N644 Mastodynia: Secondary | ICD-10-CM

## 2018-03-29 ENCOUNTER — Ambulatory Visit
Admission: RE | Admit: 2018-03-29 | Discharge: 2018-03-29 | Disposition: A | Discharge status: Home / Self Care | Payer: BLUE CROSS/BLUE SHIELD | Source: Ambulatory Visit | Attending: Internal Medicine | Admitting: Internal Medicine

## 2018-03-29 ENCOUNTER — Ambulatory Visit: Payer: BLUE CROSS/BLUE SHIELD

## 2018-03-29 DIAGNOSIS — N644 Mastodynia: Secondary | ICD-10-CM

## 2018-03-29 DIAGNOSIS — R928 Other abnormal and inconclusive findings on diagnostic imaging of breast: Secondary | ICD-10-CM | POA: Diagnosis not present

## 2018-03-29 DIAGNOSIS — N6489 Other specified disorders of breast: Secondary | ICD-10-CM | POA: Diagnosis not present

## 2018-05-27 DIAGNOSIS — J0410 Acute tracheitis without obstruction: Secondary | ICD-10-CM | POA: Diagnosis not present

## 2018-06-10 ENCOUNTER — Other Ambulatory Visit: Payer: Self-pay | Admitting: Internal Medicine

## 2018-06-10 ENCOUNTER — Ambulatory Visit
Admission: RE | Admit: 2018-06-10 | Discharge: 2018-06-10 | Disposition: A | Discharge status: Home / Self Care | Payer: BLUE CROSS/BLUE SHIELD | Source: Ambulatory Visit | Attending: Internal Medicine | Admitting: Internal Medicine

## 2018-06-10 DIAGNOSIS — J45909 Unspecified asthma, uncomplicated: Secondary | ICD-10-CM

## 2018-06-10 DIAGNOSIS — R053 Chronic cough: Secondary | ICD-10-CM

## 2018-06-10 DIAGNOSIS — R05 Cough: Secondary | ICD-10-CM

## 2018-07-02 DIAGNOSIS — Z01419 Encounter for gynecological examination (general) (routine) without abnormal findings: Secondary | ICD-10-CM | POA: Diagnosis not present

## 2018-07-02 DIAGNOSIS — Z130 Encounter for screening for diseases of the blood and blood-forming organs and certain disorders involving the immune mechanism: Secondary | ICD-10-CM | POA: Diagnosis not present

## 2018-07-02 DIAGNOSIS — Z8739 Personal history of other diseases of the musculoskeletal system and connective tissue: Secondary | ICD-10-CM | POA: Diagnosis not present

## 2018-07-02 DIAGNOSIS — Z780 Asymptomatic menopausal state: Secondary | ICD-10-CM | POA: Diagnosis not present

## 2018-07-02 DIAGNOSIS — N9089 Other specified noninflammatory disorders of vulva and perineum: Secondary | ICD-10-CM | POA: Diagnosis not present

## 2018-07-05 ENCOUNTER — Other Ambulatory Visit: Payer: Self-pay | Admitting: Gynecology

## 2018-07-05 DIAGNOSIS — E2839 Other primary ovarian failure: Secondary | ICD-10-CM

## 2018-07-30 DIAGNOSIS — Z981 Arthrodesis status: Secondary | ICD-10-CM | POA: Diagnosis not present

## 2018-11-26 DIAGNOSIS — D1801 Hemangioma of skin and subcutaneous tissue: Secondary | ICD-10-CM | POA: Diagnosis not present

## 2018-11-26 DIAGNOSIS — L819 Disorder of pigmentation, unspecified: Secondary | ICD-10-CM | POA: Diagnosis not present

## 2018-11-26 DIAGNOSIS — L821 Other seborrheic keratosis: Secondary | ICD-10-CM | POA: Diagnosis not present

## 2018-11-26 DIAGNOSIS — L570 Actinic keratosis: Secondary | ICD-10-CM | POA: Diagnosis not present

## 2019-01-03 DIAGNOSIS — H10413 Chronic giant papillary conjunctivitis, bilateral: Secondary | ICD-10-CM | POA: Diagnosis not present

## 2019-01-03 DIAGNOSIS — H40013 Open angle with borderline findings, low risk, bilateral: Secondary | ICD-10-CM | POA: Diagnosis not present

## 2019-01-03 DIAGNOSIS — D485 Neoplasm of uncertain behavior of skin: Secondary | ICD-10-CM | POA: Diagnosis not present

## 2019-01-03 DIAGNOSIS — H40053 Ocular hypertension, bilateral: Secondary | ICD-10-CM | POA: Diagnosis not present

## 2019-01-06 DIAGNOSIS — D485 Neoplasm of uncertain behavior of skin: Secondary | ICD-10-CM | POA: Diagnosis not present

## 2019-01-24 DIAGNOSIS — M76811 Anterior tibial syndrome, right leg: Secondary | ICD-10-CM | POA: Diagnosis not present

## 2019-01-24 DIAGNOSIS — M79671 Pain in right foot: Secondary | ICD-10-CM | POA: Diagnosis not present

## 2019-01-27 ENCOUNTER — Telehealth: Payer: Self-pay | Admitting: Neurology

## 2019-01-27 DIAGNOSIS — M76811 Anterior tibial syndrome, right leg: Secondary | ICD-10-CM | POA: Diagnosis not present

## 2019-01-27 NOTE — Telephone Encounter (Signed)
Due to current COVID 19 pandemic, our office is severely reducing in office visits until further notice, in order to minimize the risk to our patients and healthcare providers.  ° °Called patient to offer virtual visit for 6/10 appt. Patient accepted and verbalized understanding of the doxy process. I have sent patient an e-mail with link and instructions, as well as my name and office number/hours. Patient understands that she will receive a call from RN prior to appt. ° °Pt understands that although there may be some limitations with this type of visit, we will take all precautions to reduce any security or privacy concerns.  Pt understands that this will be treated like an in office visit and we will file with pt's insurance, and there may be a patient responsible charge related to this service. °

## 2019-01-28 ENCOUNTER — Encounter: Payer: Self-pay | Admitting: Neurology

## 2019-01-28 NOTE — Telephone Encounter (Signed)

## 2019-01-29 ENCOUNTER — Other Ambulatory Visit: Payer: Self-pay

## 2019-01-29 ENCOUNTER — Encounter: Payer: Self-pay | Admitting: Neurology

## 2019-01-29 ENCOUNTER — Ambulatory Visit (INDEPENDENT_AMBULATORY_CARE_PROVIDER_SITE_OTHER): Payer: BC Managed Care – PPO | Admitting: Neurology

## 2019-01-29 DIAGNOSIS — H5702 Anisocoria: Secondary | ICD-10-CM | POA: Diagnosis not present

## 2019-01-29 DIAGNOSIS — I671 Cerebral aneurysm, nonruptured: Secondary | ICD-10-CM

## 2019-01-29 DIAGNOSIS — H532 Diplopia: Secondary | ICD-10-CM

## 2019-01-29 DIAGNOSIS — Z8669 Personal history of other diseases of the nervous system and sense organs: Secondary | ICD-10-CM | POA: Diagnosis not present

## 2019-01-29 DIAGNOSIS — Z8679 Personal history of other diseases of the circulatory system: Secondary | ICD-10-CM

## 2019-01-29 DIAGNOSIS — G514 Facial myokymia: Secondary | ICD-10-CM

## 2019-01-29 DIAGNOSIS — Z9889 Other specified postprocedural states: Secondary | ICD-10-CM

## 2019-01-29 NOTE — Progress Notes (Signed)
Virtual Visit via Video Note  I connected with Melanie Aguirre on 01/29/19 at 11:00 AM EDT by a video enabled telemedicine application and verified that I am speaking with the correct person using two identifiers.  Location: Patient: at home  Provider: [facility]   I discussed the limitations of evaluation and management by telemedicine and the availability of in person appointments. The patient expressed understanding and agreed to proceed.  History of Present Illness: diplopia, myokymia and anisocoria.  Aneurysm. This has gotten less and less severe over the last years, some blurred vision in the very peripheral left field.  No diplopia over the last 18 month.  Social history - 2 weddings of two of her children have to be postponed, one was scheduled in [place].  She was last travelling in early M arch from [place] to [place] , has not left [place] since March 15 th and is practicing social distancing.     Observations/Objective: video - no facial asymmetry , no ptosis, normal eye movements, normal ROM for the neck.   Assessment and Plan: MRI and MRA with attention to the  Ant circle of [place].   [facility] - PLEASE ! Dr Jacqulyn Liner is a radiologist there- he works from home now.    Follow Up Instructions: Rv in 12 month unless the patient has neurological changes or the MRI /MRA reveals a new finding.      I discussed the assessment and treatment plan with the patient. The patient was provided an opportunity to ask questions and all were answered. The patient agreed with the plan and demonstrated an understanding of the instructions.   The patient was advised to call back or seek an in-person evaluation if the symptoms worsen or if the condition fails to improve as anticipated.  I provided 12 minutes of non-face-to-face time during this encounter.   Larey Seat, MD

## 2019-01-29 NOTE — Patient Instructions (Signed)
Cerebral Aneurysm    A cerebral aneurysm is a bulge that occurs in the blood vessel inside the brain. An aneurysm is caused when a weakened part of the blood vessel expands. The blood vessel expands due to the constant pressure from the flow of blood through the weakened blood vessel. As the weakened aneurysm expands, the walls of the aneurysm become weaker.  Aneurysms are dangerous because they can leak or burst (rupture). When a cerebral aneurysm ruptures, it causes bleeding in the brain (subarachnoid hemorrhage). The blood flow to the area of the brain supplied by the artery is also reduced. This can cause stroke, seizures, or coma.  A ruptured cerebral aneurysm is a medical emergency. This can cause permanent brain damage or death.  What are the causes?  The exact cause of this condition is not known.  What increases the risk?  This condition is more likely to develop in people who:   Are older. The condition is most common in people between the ages of 50-60.   Are female   Have a family history of aneurysm in two or more direct relatives.   Have certain conditions that are passed along from parent to child (inherited). They include:  ? Autosomal dominant polycystic kidney disease. This is a condition in which small, fluid-filled sacs (cysts) develop in the kidney.  ? Neurofibromatosis type 1. In this condition, flat spots develop under the skin (pigmentation) and tumors grow along nerves in the skin, brain, and other parts of the body.  ? Ehlers-Danlos syndrome. This is a condition in which bad connective tissue causes loose or unstable joints and creates a very soft skin that bruises or tears easily.   Smoke.   Have high blood pressure (hypertension).   Abuse alcohol.  What are the signs or symptoms?  The signs and symptoms of a cerebral aneurysm that has not leaked or ruptured can depend on its size and rate of growth. A small, unchanging aneurysm generally does not cause symptoms. A larger aneurysm  that is steadily growing can increase pressure on the brain or nerves.   The increased pressure from a cerebral aneurysm that has not leaked or ruptured can cause:   A headache.   Vision problems.   Numbness or weakness in an arm or leg.   Memory problems.   Problems speaking.   Seizures.  If an aneurysm leaks or ruptures, it can cause a life-threatening condition, such as stroke. Symptoms may include:   A sudden, severe headache with no known cause. The headache is often described as the worst headache ever experienced.   Stiff neck.   Nausea or vomiting, especially when combined with other symptoms, such as a headache.   Sudden weakness or numbness of the face, arm, or leg, especially on one side of the body.   Sudden trouble walking or difficulty moving the arms or legs.   Double vision.   Sudden trouble seeing in one or both eyes.   Trouble speaking or understanding speech (aphasia).   Trouble swallowing.   Dizziness.   Loss of balance or coordination.   Intolerance to light.   Sudden confusion or loss of consciousness.  How is this diagnosed?  This condition is diagnosed using certain tests, including:   CT scan.   Computed tomographic angiogram (CTA). This test uses a dye and a scanner to produce images of your blood vessels.   Magnetic resonance angiogram (MRA). This test uses an MRI machine to produce images of your   blood vessels.   Digital subtraction angiogram (DSA). This test involves placing a flexible, thin tube (catheter) into the artery in your thigh and guiding it up to the arteries in the brain. A dye is then injected into the area and X-rays are taken to create images of your blood vessels.  How is this treated?  Unruptured aneurysm  Treatment is complex when an aneurysm is found and it is not causing problems. Treatment is individualized, as each case is different. Many factors must be considered, such as the size and exact location of your aneurysm, your age, your overall  health, and your preferences. Small aneurysms in certain locations of the brain have a very low chance of bleeding or rupturing. These small aneurysms may not be treated.   In some cases, however, treatment may be required. Treatment depends on the size and location of the aneurysm. They may include:   Coiling. During this procedure, a catheter is inserted and advanced through a blood vessel. Once the catheter reaches the aneurysm, tiny coils are used to block blood flow into the aneurysm. This procedure is sometimes done at the same time as a DSA.   Surgical clipping. During surgery, a clip is placed at the base of the aneurysm. The clip prevents blood from continuing to enter the aneurysm.   Flow diversion. This procedure is used to divert blood flow around the aneurysm with a stent that is placed across the opening of an aneurysm.  Ruptured aneurysm  Immediate emergency surgery or coiling may be needed to help prevent damage to the brain and to reduce the risk of rebleeding. The timing of treatment is an important factor in preventing complications. Successful early treatment of a ruptured aneurysm within the first 3 days of a bleed helps to prevent rebleeding and blood vessel spasm. In some cases, there may be a reason to treat 10-14 days after a rupture. Many factors are considered when making this decision, and each case is handled individually.  Follow these instructions at home:  If your aneurysm is not treated:   Take over-the-counter and prescription medicines only as told by your health care provider.   Follow a diet suggested by your health care provider. Certain dietary changes may be advised to address high blood pressure (hypertension), such as choosing foods that are low in salt (sodium), saturated fat, trans fat, and cholesterol.   Stay physically active. It is recommended that you get at least 30 minutes of activity on most or all days.   Do not use any products that contain nicotine or  tobacco, such as cigarettes and e-cigarettes. If you need help quitting, ask your health care provider.   Limit alcohol intake to no more than 1 drink a day for nonpregnant women and 2 drinks a day for men. One drink equals 12 oz of beer, 5 oz of wine, or 1 oz of hard liquor.   Do not use street drugs. If you need help quitting, ask your health care provider.   Keep all follow-up visits as told by your health care provider. This is important. This includes any referrals, imaging studies, and laboratory tests. Proper follow-up may prevent an aneurysm rupture or a stroke.  Get help right away if:   You have a sudden, severe headache with no known cause. This may include a stiff neck.   You have sudden nausea or vomiting with a severe headache.   You have a seizure.   You have other symptoms of stroke.   The acronym BEFAST is an easy way to remember the main warning signs of stroke.  ? B = Balance problems. Signs include dizziness, sudden trouble walking, or loss of balance.  ? E = Eye problems. This includes trouble seeing or a sudden change in vision.  ? F = Face changes. This includes sudden weakness or numbness of the face, or the face or eyelid drooping to one side.  ? A = Arm weakness or numbness. This happens suddenly and usually on one side of the body.  ? S = Speech problems. This includes trouble speaking or trouble understanding.  ? T = Time. Time to call 911 or seek emergency care. Do not wait to see if symptoms will go away. Make note of the time your symptoms started.  These symptoms may represent a serious problem that is an emergency. Do not wait to see if the symptoms will go away. Get medical help right away. Call your local emergency services (911 in the [place] Do not drive yourself to the hospital.  Summary   An aneurysm is a bulge in an artery.   Aneurysms are dangerous because they can leak or burst (rupture). When a cerebral aneurysm ruptures, it causes bleeding in the brain.   Treatment  depends on whether the aneurysm is ruptured. A ruptured aneurysm is a medical emergency.   Get help right away if you have symptoms of stroke. The acronym BEFAST is an easy way to remember the main warning signs of stroke.  This information is not intended to replace advice given to you by your health care provider. Make sure you discuss any questions you have with your health care provider.  Document Released: 04/29/2002 Document Revised: 04/26/2018 Document Reviewed: 09/14/2016  Elsevier Interactive Patient Education  2019 Elsevier Inc.

## 2019-01-30 ENCOUNTER — Encounter: Payer: Self-pay | Admitting: Neurology

## 2019-02-03 ENCOUNTER — Telehealth: Payer: Self-pay | Admitting: Neurology

## 2019-02-03 DIAGNOSIS — M76819 Anterior tibial syndrome, unspecified leg: Secondary | ICD-10-CM | POA: Diagnosis not present

## 2019-02-03 NOTE — Telephone Encounter (Signed)
[employer] [place] Auth: NPR Ref # [id number] Girt on 02/03/19 at 10:21 AM [place] order sent to [facility]. They will reach out to the patient to schedule.

## 2019-02-05 DIAGNOSIS — M76819 Anterior tibial syndrome, unspecified leg: Secondary | ICD-10-CM | POA: Diagnosis not present

## 2019-02-07 DIAGNOSIS — M76819 Anterior tibial syndrome, unspecified leg: Secondary | ICD-10-CM | POA: Diagnosis not present

## 2019-02-10 DIAGNOSIS — D485 Neoplasm of uncertain behavior of skin: Secondary | ICD-10-CM | POA: Diagnosis not present

## 2019-02-10 DIAGNOSIS — L988 Other specified disorders of the skin and subcutaneous tissue: Secondary | ICD-10-CM | POA: Diagnosis not present

## 2019-02-12 DIAGNOSIS — M76819 Anterior tibial syndrome, unspecified leg: Secondary | ICD-10-CM | POA: Diagnosis not present

## 2019-02-14 DIAGNOSIS — M76819 Anterior tibial syndrome, unspecified leg: Secondary | ICD-10-CM | POA: Diagnosis not present

## 2019-02-17 DIAGNOSIS — M76811 Anterior tibial syndrome, right leg: Secondary | ICD-10-CM | POA: Diagnosis not present

## 2019-02-19 DIAGNOSIS — E871 Hypo-osmolality and hyponatremia: Secondary | ICD-10-CM | POA: Diagnosis not present

## 2019-02-19 DIAGNOSIS — R0989 Other specified symptoms and signs involving the circulatory and respiratory systems: Secondary | ICD-10-CM | POA: Diagnosis not present

## 2019-02-19 DIAGNOSIS — R635 Abnormal weight gain: Secondary | ICD-10-CM | POA: Diagnosis not present

## 2019-02-19 DIAGNOSIS — E559 Vitamin D deficiency, unspecified: Secondary | ICD-10-CM | POA: Diagnosis not present

## 2019-02-19 DIAGNOSIS — Z0000 Encounter for general adult medical examination without abnormal findings: Secondary | ICD-10-CM | POA: Diagnosis not present

## 2019-03-24 DIAGNOSIS — M76811 Anterior tibial syndrome, right leg: Secondary | ICD-10-CM | POA: Diagnosis not present

## 2019-04-09 ENCOUNTER — Other Ambulatory Visit: Payer: Self-pay | Admitting: Internal Medicine

## 2019-04-09 DIAGNOSIS — Z1231 Encounter for screening mammogram for malignant neoplasm of breast: Secondary | ICD-10-CM

## 2019-04-11 ENCOUNTER — Other Ambulatory Visit: Payer: Self-pay | Admitting: Neurology

## 2019-04-14 ENCOUNTER — Other Ambulatory Visit: Payer: Self-pay | Admitting: Internal Medicine

## 2019-04-14 DIAGNOSIS — R590 Localized enlarged lymph nodes: Secondary | ICD-10-CM

## 2019-04-15 ENCOUNTER — Other Ambulatory Visit: Payer: BLUE CROSS/BLUE SHIELD

## 2019-04-16 DIAGNOSIS — H40013 Open angle with borderline findings, low risk, bilateral: Secondary | ICD-10-CM | POA: Diagnosis not present

## 2019-04-16 DIAGNOSIS — H40053 Ocular hypertension, bilateral: Secondary | ICD-10-CM | POA: Diagnosis not present

## 2019-04-16 DIAGNOSIS — H0100B Unspecified blepharitis left eye, upper and lower eyelids: Secondary | ICD-10-CM | POA: Diagnosis not present

## 2019-04-16 DIAGNOSIS — H0100A Unspecified blepharitis right eye, upper and lower eyelids: Secondary | ICD-10-CM | POA: Diagnosis not present

## 2019-04-21 ENCOUNTER — Ambulatory Visit
Admission: RE | Admit: 2019-04-21 | Discharge: 2019-04-21 | Disposition: A | Discharge status: Home / Self Care | Payer: BC Managed Care – PPO | Source: Ambulatory Visit | Attending: Internal Medicine | Admitting: Internal Medicine

## 2019-04-21 ENCOUNTER — Other Ambulatory Visit: Payer: Self-pay

## 2019-04-21 DIAGNOSIS — N6489 Other specified disorders of breast: Secondary | ICD-10-CM | POA: Diagnosis not present

## 2019-04-21 DIAGNOSIS — R590 Localized enlarged lymph nodes: Secondary | ICD-10-CM

## 2019-04-21 DIAGNOSIS — R922 Inconclusive mammogram: Secondary | ICD-10-CM | POA: Diagnosis not present

## 2019-05-05 DIAGNOSIS — C441222 Squamous cell carcinoma of skin of right lower eyelid, including canthus: Secondary | ICD-10-CM | POA: Diagnosis not present

## 2019-05-05 DIAGNOSIS — Z481 Encounter for planned postprocedural wound closure: Secondary | ICD-10-CM | POA: Diagnosis not present

## 2019-05-05 DIAGNOSIS — Z483 Aftercare following surgery for neoplasm: Secondary | ICD-10-CM | POA: Diagnosis not present

## 2019-05-05 DIAGNOSIS — H05221 Edema of right orbit: Secondary | ICD-10-CM | POA: Diagnosis not present

## 2019-05-05 DIAGNOSIS — H0259 Other disorders affecting eyelid function: Secondary | ICD-10-CM | POA: Diagnosis not present

## 2019-05-05 DIAGNOSIS — S01411S Laceration without foreign body of right cheek and temporomandibular area, sequela: Secondary | ICD-10-CM | POA: Diagnosis not present

## 2019-05-05 DIAGNOSIS — S0181XS Laceration without foreign body of other part of head, sequela: Secondary | ICD-10-CM | POA: Diagnosis not present

## 2019-05-05 DIAGNOSIS — Z85828 Personal history of other malignant neoplasm of skin: Secondary | ICD-10-CM | POA: Diagnosis not present

## 2019-05-21 DIAGNOSIS — H40051 Ocular hypertension, right eye: Secondary | ICD-10-CM | POA: Diagnosis not present

## 2019-05-21 DIAGNOSIS — H16101 Unspecified superficial keratitis, right eye: Secondary | ICD-10-CM | POA: Diagnosis not present

## 2019-05-21 DIAGNOSIS — H02052 Trichiasis without entropian right lower eyelid: Secondary | ICD-10-CM | POA: Diagnosis not present

## 2019-05-22 DIAGNOSIS — C441221 Squamous cell carcinoma of skin of right upper eyelid, including canthus: Secondary | ICD-10-CM | POA: Diagnosis not present

## 2019-06-18 ENCOUNTER — Other Ambulatory Visit: Payer: Self-pay | Admitting: Gynecology

## 2019-06-18 DIAGNOSIS — E2839 Other primary ovarian failure: Secondary | ICD-10-CM

## 2019-06-20 DIAGNOSIS — Z23 Encounter for immunization: Secondary | ICD-10-CM | POA: Diagnosis not present

## 2019-06-23 ENCOUNTER — Ambulatory Visit: Payer: BLUE CROSS/BLUE SHIELD

## 2019-06-23 ENCOUNTER — Other Ambulatory Visit: Payer: BLUE CROSS/BLUE SHIELD

## 2019-07-25 DIAGNOSIS — H0014 Chalazion left upper eyelid: Secondary | ICD-10-CM | POA: Diagnosis not present

## 2019-08-06 ENCOUNTER — Other Ambulatory Visit: Payer: BC Managed Care – PPO

## 2019-08-06 ENCOUNTER — Other Ambulatory Visit: Payer: Self-pay

## 2019-08-06 ENCOUNTER — Ambulatory Visit: Payer: BC Managed Care – PPO | Attending: Internal Medicine

## 2019-08-06 DIAGNOSIS — Z20822 Contact with and (suspected) exposure to covid-19: Secondary | ICD-10-CM

## 2019-08-06 DIAGNOSIS — Z20828 Contact with and (suspected) exposure to other viral communicable diseases: Secondary | ICD-10-CM | POA: Diagnosis not present

## 2019-08-08 DIAGNOSIS — Z20828 Contact with and (suspected) exposure to other viral communicable diseases: Secondary | ICD-10-CM | POA: Diagnosis not present

## 2019-08-08 LAB — NOVEL CORONAVIRUS, NAA: SARS-CoV-2, NAA: NOT DETECTED

## 2019-08-09 DIAGNOSIS — Z20828 Contact with and (suspected) exposure to other viral communicable diseases: Secondary | ICD-10-CM | POA: Diagnosis not present

## 2019-08-25 DIAGNOSIS — Z8489 Family history of other specified conditions: Secondary | ICD-10-CM | POA: Diagnosis not present

## 2019-08-25 DIAGNOSIS — D6859 Other primary thrombophilia: Secondary | ICD-10-CM | POA: Diagnosis not present

## 2019-08-25 DIAGNOSIS — L905 Scar conditions and fibrosis of skin: Secondary | ICD-10-CM | POA: Diagnosis not present

## 2019-08-26 DIAGNOSIS — D6859 Other primary thrombophilia: Secondary | ICD-10-CM | POA: Diagnosis not present

## 2019-08-26 DIAGNOSIS — Z8489 Family history of other specified conditions: Secondary | ICD-10-CM | POA: Diagnosis not present

## 2019-09-25 DIAGNOSIS — Z780 Asymptomatic menopausal state: Secondary | ICD-10-CM | POA: Diagnosis not present

## 2019-09-25 DIAGNOSIS — Z130 Encounter for screening for diseases of the blood and blood-forming organs and certain disorders involving the immune mechanism: Secondary | ICD-10-CM | POA: Diagnosis not present

## 2019-09-25 DIAGNOSIS — Z124 Encounter for screening for malignant neoplasm of cervix: Secondary | ICD-10-CM | POA: Diagnosis not present

## 2019-09-25 DIAGNOSIS — Z6826 Body mass index (BMI) 26.0-26.9, adult: Secondary | ICD-10-CM | POA: Diagnosis not present

## 2019-09-25 DIAGNOSIS — Z01419 Encounter for gynecological examination (general) (routine) without abnormal findings: Secondary | ICD-10-CM | POA: Diagnosis not present

## 2019-09-25 DIAGNOSIS — Z1151 Encounter for screening for human papillomavirus (HPV): Secondary | ICD-10-CM | POA: Diagnosis not present

## 2019-10-04 ENCOUNTER — Ambulatory Visit: Payer: BC Managed Care – PPO | Attending: Internal Medicine

## 2019-10-04 DIAGNOSIS — Z23 Encounter for immunization: Secondary | ICD-10-CM

## 2019-10-04 NOTE — Progress Notes (Signed)
   Covid-19 Vaccination Clinic  Name:  Melanie Aguirre    MRN: [id number] DOB: September 01, 1960  10/04/2019  [name] was observed post Covid-19 immunization for 15 minutes without incidence. She was provided with Vaccine Information Sheet and instruction to access the V-Safe system.   [name] was instructed to call 911 with any severe reactions post vaccine: Marland Kitchen Difficulty breathing  . Swelling of your face and throat  . A fast heartbeat  . A bad rash all over your body  . Dizziness and weakness    Immunizations Administered    Name Date Dose VIS Date Route   Pfizer COVID-19 Vaccine 10/04/2019  1:46 PM 0.3 mL 08/01/2019 Intramuscular   Manufacturer: [employer], [employer]   Lot: [id number]   NDC: [address]-5

## 2019-10-08 DIAGNOSIS — H0012 Chalazion right lower eyelid: Secondary | ICD-10-CM | POA: Diagnosis not present

## 2019-10-08 DIAGNOSIS — C441222 Squamous cell carcinoma of skin of right lower eyelid, including canthus: Secondary | ICD-10-CM | POA: Diagnosis not present

## 2019-10-08 DIAGNOSIS — Z09 Encounter for follow-up examination after completed treatment for conditions other than malignant neoplasm: Secondary | ICD-10-CM | POA: Diagnosis not present

## 2019-10-17 DIAGNOSIS — H40053 Ocular hypertension, bilateral: Secondary | ICD-10-CM | POA: Diagnosis not present

## 2019-10-17 DIAGNOSIS — H40013 Open angle with borderline findings, low risk, bilateral: Secondary | ICD-10-CM | POA: Diagnosis not present

## 2019-10-17 DIAGNOSIS — H0014 Chalazion left upper eyelid: Secondary | ICD-10-CM | POA: Diagnosis not present

## 2019-10-20 DIAGNOSIS — Z09 Encounter for follow-up examination after completed treatment for conditions other than malignant neoplasm: Secondary | ICD-10-CM | POA: Diagnosis not present

## 2019-10-20 DIAGNOSIS — H0289 Other specified disorders of eyelid: Secondary | ICD-10-CM | POA: Diagnosis not present

## 2019-10-20 DIAGNOSIS — L905 Scar conditions and fibrosis of skin: Secondary | ICD-10-CM | POA: Diagnosis not present

## 2019-10-20 DIAGNOSIS — C441222 Squamous cell carcinoma of skin of right lower eyelid, including canthus: Secondary | ICD-10-CM | POA: Diagnosis not present

## 2019-10-27 ENCOUNTER — Ambulatory Visit: Payer: BC Managed Care – PPO | Attending: Internal Medicine

## 2019-10-27 DIAGNOSIS — Z23 Encounter for immunization: Secondary | ICD-10-CM | POA: Insufficient documentation

## 2019-10-27 NOTE — Progress Notes (Signed)
   Covid-19 Vaccination Clinic  Name:  RANDELL DETTER    MRN: [id number] DOB: Sep 04, 1960  10/27/2019  Ms. Ehrsam was observed post Covid-19 immunization for 15 minutes without incident. She was provided with Vaccine Information Sheet and instruction to access the V-Safe system.   Ms. Larue was instructed to call 911 with any severe reactions post vaccine: Marland Kitchen Difficulty breathing  . Swelling of face and throat  . A fast heartbeat  . A bad rash all over body  . Dizziness and weakness   Immunizations Administered    Name Date Dose VIS Date Route   Pfizer COVID-19 Vaccine 10/27/2019 11:19 AM 0.3 mL 08/01/2019 Intramuscular   Manufacturer: [employer], [employer]   Lot: [id number]   NDC: [address]-1

## 2019-11-26 ENCOUNTER — Other Ambulatory Visit: Payer: BC Managed Care – PPO

## 2019-12-08 DIAGNOSIS — Z09 Encounter for follow-up examination after completed treatment for conditions other than malignant neoplasm: Secondary | ICD-10-CM | POA: Diagnosis not present

## 2019-12-08 DIAGNOSIS — E871 Hypo-osmolality and hyponatremia: Secondary | ICD-10-CM | POA: Diagnosis not present

## 2019-12-08 DIAGNOSIS — R635 Abnormal weight gain: Secondary | ICD-10-CM | POA: Diagnosis not present

## 2019-12-08 DIAGNOSIS — L905 Scar conditions and fibrosis of skin: Secondary | ICD-10-CM | POA: Diagnosis not present

## 2019-12-08 DIAGNOSIS — E559 Vitamin D deficiency, unspecified: Secondary | ICD-10-CM | POA: Diagnosis not present

## 2019-12-08 DIAGNOSIS — H0289 Other specified disorders of eyelid: Secondary | ICD-10-CM | POA: Diagnosis not present

## 2019-12-08 DIAGNOSIS — H539 Unspecified visual disturbance: Secondary | ICD-10-CM | POA: Diagnosis not present

## 2019-12-08 DIAGNOSIS — C441222 Squamous cell carcinoma of skin of right lower eyelid, including canthus: Secondary | ICD-10-CM | POA: Diagnosis not present

## 2019-12-08 DIAGNOSIS — Z0000 Encounter for general adult medical examination without abnormal findings: Secondary | ICD-10-CM | POA: Diagnosis not present

## 2019-12-30 DIAGNOSIS — Z03818 Encounter for observation for suspected exposure to other biological agents ruled out: Secondary | ICD-10-CM | POA: Diagnosis not present

## 2019-12-30 DIAGNOSIS — Z20828 Contact with and (suspected) exposure to other viral communicable diseases: Secondary | ICD-10-CM | POA: Diagnosis not present

## 2019-12-30 DIAGNOSIS — Z20822 Contact with and (suspected) exposure to covid-19: Secondary | ICD-10-CM | POA: Diagnosis not present

## 2020-01-02 DIAGNOSIS — L738 Other specified follicular disorders: Secondary | ICD-10-CM | POA: Diagnosis not present

## 2020-01-02 DIAGNOSIS — L821 Other seborrheic keratosis: Secondary | ICD-10-CM | POA: Diagnosis not present

## 2020-01-02 DIAGNOSIS — D2262 Melanocytic nevi of left upper limb, including shoulder: Secondary | ICD-10-CM | POA: Diagnosis not present

## 2020-01-02 DIAGNOSIS — L570 Actinic keratosis: Secondary | ICD-10-CM | POA: Diagnosis not present

## 2020-01-02 DIAGNOSIS — Z85828 Personal history of other malignant neoplasm of skin: Secondary | ICD-10-CM | POA: Diagnosis not present

## 2020-01-05 DIAGNOSIS — J40 Bronchitis, not specified as acute or chronic: Secondary | ICD-10-CM | POA: Diagnosis not present

## 2020-01-05 DIAGNOSIS — R05 Cough: Secondary | ICD-10-CM | POA: Diagnosis not present

## 2020-01-05 DIAGNOSIS — J019 Acute sinusitis, unspecified: Secondary | ICD-10-CM | POA: Diagnosis not present

## 2020-01-17 DIAGNOSIS — R300 Dysuria: Secondary | ICD-10-CM | POA: Diagnosis not present

## 2020-01-17 DIAGNOSIS — N390 Urinary tract infection, site not specified: Secondary | ICD-10-CM | POA: Diagnosis not present

## 2020-01-17 DIAGNOSIS — R319 Hematuria, unspecified: Secondary | ICD-10-CM | POA: Diagnosis not present

## 2020-01-17 DIAGNOSIS — B9620 Unspecified Escherichia coli [E. coli] as the cause of diseases classified elsewhere: Secondary | ICD-10-CM | POA: Diagnosis not present

## 2020-01-27 DIAGNOSIS — N390 Urinary tract infection, site not specified: Secondary | ICD-10-CM | POA: Diagnosis not present

## 2020-03-08 DIAGNOSIS — Z0000 Encounter for general adult medical examination without abnormal findings: Secondary | ICD-10-CM | POA: Diagnosis not present

## 2020-03-11 ENCOUNTER — Other Ambulatory Visit: Payer: Self-pay | Admitting: Gynecology

## 2020-03-11 DIAGNOSIS — Z1231 Encounter for screening mammogram for malignant neoplasm of breast: Secondary | ICD-10-CM

## 2020-03-24 DIAGNOSIS — Z20822 Contact with and (suspected) exposure to covid-19: Secondary | ICD-10-CM | POA: Diagnosis not present

## 2020-03-25 DIAGNOSIS — Z03818 Encounter for observation for suspected exposure to other biological agents ruled out: Secondary | ICD-10-CM | POA: Diagnosis not present

## 2020-03-25 DIAGNOSIS — Z20822 Contact with and (suspected) exposure to covid-19: Secondary | ICD-10-CM | POA: Diagnosis not present

## 2020-03-26 DIAGNOSIS — Z20822 Contact with and (suspected) exposure to covid-19: Secondary | ICD-10-CM | POA: Diagnosis not present

## 2020-03-27 DIAGNOSIS — Z20822 Contact with and (suspected) exposure to covid-19: Secondary | ICD-10-CM | POA: Diagnosis not present

## 2020-03-27 DIAGNOSIS — Z03818 Encounter for observation for suspected exposure to other biological agents ruled out: Secondary | ICD-10-CM | POA: Diagnosis not present

## 2020-04-05 DIAGNOSIS — Z20822 Contact with and (suspected) exposure to covid-19: Secondary | ICD-10-CM | POA: Diagnosis not present

## 2020-04-13 DIAGNOSIS — Z20822 Contact with and (suspected) exposure to covid-19: Secondary | ICD-10-CM | POA: Diagnosis not present

## 2020-04-14 DIAGNOSIS — Z20822 Contact with and (suspected) exposure to covid-19: Secondary | ICD-10-CM | POA: Diagnosis not present

## 2020-04-14 DIAGNOSIS — Z03818 Encounter for observation for suspected exposure to other biological agents ruled out: Secondary | ICD-10-CM | POA: Diagnosis not present

## 2020-04-15 DIAGNOSIS — Z20822 Contact with and (suspected) exposure to covid-19: Secondary | ICD-10-CM | POA: Diagnosis not present

## 2020-04-21 ENCOUNTER — Ambulatory Visit
Admission: RE | Admit: 2020-04-21 | Discharge: 2020-04-21 | Disposition: A | Discharge status: Home / Self Care | Payer: BC Managed Care – PPO | Source: Ambulatory Visit | Attending: Gynecology | Admitting: Gynecology

## 2020-04-21 ENCOUNTER — Other Ambulatory Visit: Payer: Self-pay

## 2020-04-21 ENCOUNTER — Other Ambulatory Visit: Payer: Self-pay | Admitting: Gynecology

## 2020-04-21 DIAGNOSIS — M8580 Other specified disorders of bone density and structure, unspecified site: Secondary | ICD-10-CM

## 2020-04-21 DIAGNOSIS — Z780 Asymptomatic menopausal state: Secondary | ICD-10-CM | POA: Diagnosis not present

## 2020-04-21 DIAGNOSIS — Z1231 Encounter for screening mammogram for malignant neoplasm of breast: Secondary | ICD-10-CM | POA: Diagnosis not present

## 2020-04-21 DIAGNOSIS — M85851 Other specified disorders of bone density and structure, right thigh: Secondary | ICD-10-CM | POA: Diagnosis not present

## 2020-04-22 DIAGNOSIS — H40053 Ocular hypertension, bilateral: Secondary | ICD-10-CM | POA: Diagnosis not present

## 2020-04-22 DIAGNOSIS — H40013 Open angle with borderline findings, low risk, bilateral: Secondary | ICD-10-CM | POA: Diagnosis not present

## 2020-04-22 DIAGNOSIS — H5203 Hypermetropia, bilateral: Secondary | ICD-10-CM | POA: Diagnosis not present

## 2020-05-03 DIAGNOSIS — H5711 Ocular pain, right eye: Secondary | ICD-10-CM | POA: Diagnosis not present

## 2020-05-03 DIAGNOSIS — H0012 Chalazion right lower eyelid: Secondary | ICD-10-CM | POA: Diagnosis not present

## 2020-07-12 DIAGNOSIS — L570 Actinic keratosis: Secondary | ICD-10-CM | POA: Diagnosis not present

## 2020-08-10 DIAGNOSIS — R519 Headache, unspecified: Secondary | ICD-10-CM | POA: Diagnosis not present

## 2020-08-10 DIAGNOSIS — Z20822 Contact with and (suspected) exposure to covid-19: Secondary | ICD-10-CM | POA: Diagnosis not present

## 2020-11-12 DIAGNOSIS — Z6826 Body mass index (BMI) 26.0-26.9, adult: Secondary | ICD-10-CM | POA: Diagnosis not present

## 2020-11-12 DIAGNOSIS — M47816 Spondylosis without myelopathy or radiculopathy, lumbar region: Secondary | ICD-10-CM | POA: Diagnosis not present

## 2020-11-12 DIAGNOSIS — R030 Elevated blood-pressure reading, without diagnosis of hypertension: Secondary | ICD-10-CM | POA: Diagnosis not present

## 2020-12-01 ENCOUNTER — Other Ambulatory Visit: Payer: Self-pay | Admitting: Neurological Surgery

## 2020-12-01 DIAGNOSIS — M47816 Spondylosis without myelopathy or radiculopathy, lumbar region: Secondary | ICD-10-CM

## 2020-12-08 DIAGNOSIS — H0012 Chalazion right lower eyelid: Secondary | ICD-10-CM | POA: Diagnosis not present

## 2020-12-08 DIAGNOSIS — H40053 Ocular hypertension, bilateral: Secondary | ICD-10-CM | POA: Diagnosis not present

## 2020-12-16 DIAGNOSIS — Z6826 Body mass index (BMI) 26.0-26.9, adult: Secondary | ICD-10-CM | POA: Diagnosis not present

## 2020-12-16 DIAGNOSIS — Z130 Encounter for screening for diseases of the blood and blood-forming organs and certain disorders involving the immune mechanism: Secondary | ICD-10-CM | POA: Diagnosis not present

## 2020-12-16 DIAGNOSIS — Z780 Asymptomatic menopausal state: Secondary | ICD-10-CM | POA: Diagnosis not present

## 2020-12-16 DIAGNOSIS — Z01419 Encounter for gynecological examination (general) (routine) without abnormal findings: Secondary | ICD-10-CM | POA: Diagnosis not present

## 2020-12-29 DIAGNOSIS — L820 Inflamed seborrheic keratosis: Secondary | ICD-10-CM | POA: Diagnosis not present

## 2021-01-19 DIAGNOSIS — L821 Other seborrheic keratosis: Secondary | ICD-10-CM | POA: Diagnosis not present

## 2021-01-19 DIAGNOSIS — Z85828 Personal history of other malignant neoplasm of skin: Secondary | ICD-10-CM | POA: Diagnosis not present

## 2021-01-19 DIAGNOSIS — D2261 Melanocytic nevi of right upper limb, including shoulder: Secondary | ICD-10-CM | POA: Diagnosis not present

## 2021-01-19 DIAGNOSIS — D225 Melanocytic nevi of trunk: Secondary | ICD-10-CM | POA: Diagnosis not present

## 2021-02-17 ENCOUNTER — Encounter (INDEPENDENT_AMBULATORY_CARE_PROVIDER_SITE_OTHER): Payer: BC Managed Care – PPO | Admitting: Ophthalmology

## 2021-02-17 ENCOUNTER — Ambulatory Visit (INDEPENDENT_AMBULATORY_CARE_PROVIDER_SITE_OTHER): Payer: BC Managed Care – PPO | Admitting: Ophthalmology

## 2021-02-17 ENCOUNTER — Other Ambulatory Visit: Payer: Self-pay

## 2021-02-17 ENCOUNTER — Encounter (INDEPENDENT_AMBULATORY_CARE_PROVIDER_SITE_OTHER): Payer: Self-pay | Admitting: Ophthalmology

## 2021-02-17 DIAGNOSIS — H43813 Vitreous degeneration, bilateral: Secondary | ICD-10-CM

## 2021-02-17 DIAGNOSIS — H4312 Vitreous hemorrhage, left eye: Secondary | ICD-10-CM | POA: Diagnosis not present

## 2021-02-17 NOTE — Progress Notes (Signed)
02/17/2021     CHIEF COMPLAINT Patient presents for No chief complaint on file.   HISTORY OF PRESENT ILLNESS: Melanie Aguirre is a 60 y.o. female who presents to the clinic today for:     Referring physician: No referring provider defined for this encounter.  HISTORICAL INFORMATION:   Selected notes from the MEDICAL RECORD NUMBER       CURRENT MEDICATIONS: Current Outpatient Medications (Ophthalmic Drugs)  Medication Sig   Epinastine HCl 0.05 % ophthalmic solution INSTILL 1 DROP INTO BOTH EYES TWICE A DAY AS NEEDED FOR ALLERGY   No current facility-administered medications for this visit. (Ophthalmic Drugs)   Current Outpatient Medications (Other)  Medication Sig   magnesium oxide (MAG-OX) 400 MG tablet Take 400 mg by mouth daily.   Multiple Vitamin (MULTIVITAMIN) tablet Take 1 tablet by mouth daily.   omega-3 acid ethyl esters (LOVAZA) 1 G capsule Take 1 g by mouth daily.    omeprazole (PRILOSEC) 40 MG capsule Take 40 mg by mouth daily. Delayed release,as needed   triamcinolone cream (KENALOG) 0.1 % Apply 1 application topically 2 (two) times daily.    valGANciclovir (VALCYTE) 450 MG tablet Take 450 mg by mouth as needed (for cold sores).    VITAMIN D, CHOLECALCIFEROL, PO Take 2,000 Units by mouth daily.   zolpidem (AMBIEN) 10 MG tablet Take 10 mg by mouth at bedtime as needed for sleep.    No current facility-administered medications for this visit. (Other)      REVIEW OF SYSTEMS:    ALLERGIES Allergies  Allergen Reactions   Erythromycin     Rash, shortness of breath   Lavender Oil Swelling    Makes eyes swell    PAST MEDICAL HISTORY Past Medical History:  Diagnosis Date   Anemia    as a teenager   Anxiety    last 4 months    Back spasm 1998   episodic severe low back, felt to have probably from lthe L5-S1 area. Usually responds to oxycodone one or two doses.   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-has seen Dr [name] at [facility] and Dr Mariana Kaufman at [facility]   Past Surgical History:  Procedure Laterality Date   ANTERIOR CERVICAL DECOMP/DISCECTOMY FUSION N/A 09/04/2013   Procedure: ANTERIOR CERVICAL DECOMPRESSION/DISCECTOMY FUSION 1 LEVEL/HARDWARE REMOVAL;  Surgeon: Hewitt Shorts, MD;  Location: [facility];  Service: Neurosurgery;  Laterality: N/A;  Hardware Removal Tether Plate F0-9   BREAST BIOPSY Right    BREAST SURGERY Right    biopsy (negative)   CERVICAL SPINE SURGERY  04/2010   Dr Lauralyn Primes   INTRACRANIAL ANEURYSM REPAIR  09/2012   coiled-[facility]    FAMILY HISTORY Family History  Problem Relation Age of Onset   Hypertension Father    Congestive Heart Failure Maternal Grandfather    Breast cancer Paternal Grandmother    Cancer Paternal Grandfather        lung, throat    SOCIAL HISTORY Social History   Tobacco Use   Smoking status: Never   Smokeless tobacco: Never  Substance Use Topics   Alcohol  use: Yes    Comment: 4-5 times a week   Drug use: No         OPHTHALMIC EXAM:  Base Eye Exam     Visual Acuity (ETDRS)       Right Left   Dist [place] 20/20 20/20         Tonometry (Tonopen, 7:37 AM)       Right Left   Pressure 21 21         Pupils       Pupils React APD   Right PERRL Brisk None   Left PERRL Brisk None         Dilation     Both eyes: 1.0% Mydriacyl, 2.5% Phenylephrine @ 7:34 AM           Slit Lamp and Fundus Exam     External Exam       Right Left   External Normal Normal         Slit Lamp Exam       Right Left   Lids/Lashes Normal Normal   Conjunctiva/Sclera White  and quiet White and quiet   Cornea Clear Clear   Anterior Chamber Deep and quiet Deep and quiet   Iris Round and reactive Round and reactive   Lens 1+ Nuclear sclerosis 1+ Nuclear sclerosis   Anterior Vitreous Normal Normal         Fundus Exam       Right Left   Posterior Vitreous Posterior vitreous detachment Posterior vitreous detachment, Central vitreous floaters   Disc Normal Hemorrhage nasal aspect of nerve likely from vitreal papillary traction recent release with PVD   C/D Ratio 0.1 0.1   Macula Normal Normal   Vessels Normal Normal   Periphery Normal Normal, no holes or tears, 20 5D examination scleral depression 360            IMAGING AND PROCEDURES  Imaging and Procedures for 02/17/21  OCT, Retina - OU - Both Eyes       Right Eye Quality was good. Scan locations included subfoveal. Central Foveal Thickness: 293. Progression has no prior data. Findings include normal foveal contour.   Left Eye Quality was good. Central Foveal Thickness: 290. Progression has no prior data. Findings include normal foveal contour.   Notes Incidental posterior vitreous detachment OD with perifoveal operculum, no maculopathy OD  OS, posterior vitreous detachment with tiny vitreous debris, signifying likely dots of RBCs or other material.  No traction no active maculopathy     Color Fundus Photography Optos - OU - Both Eyes       Right Eye Progression has no prior data. Disc findings include normal observations. Macula : normal observations. Vessels : normal observations. Periphery : normal observations.   Left Eye Progression has no prior data. Macula : normal observations. Vessels : normal observations. Periphery : normal observations.   Notes OS, with dot hemorrhage on nasal aspect optic nerve, reviewed with patient, this is likely a sign of vitreal papillary traction with clinical association that there may still be some residual traction at that area.  This is the  likely cause of the vitreous debris noted clinically and symptomatically.  No retinal holes or tears noted peripherally             ASSESSMENT/PLAN:  No problem-specific Assessment & Plan notes found for this encounter.      ICD-10-CM   1. Posterior vitreous detachment of both eyes  H43.813 OCT, Retina - OU -  Both Eyes    2. Vitreous hemorrhage of left eye (HCC)  H43.12 Color Fundus Photography Optos - OU - Both Eyes      1.  2.  3.  Ophthalmic Meds Ordered this visit:  No orders of the defined types were placed in this encounter.      Return in about 6 weeks (around 03/31/2021) for dilate, OS, COLOR FP.  There are no Patient Instructions on file for this visit.   Explained the diagnoses, plan, and follow up with the patient and they expressed understanding.  Patient expressed understanding of the importance of proper follow up care.   Alford Highland Melanie Aguirre M.D. Diseases & Surgery of the Retina and Vitreous [facility] 02/17/21     Abbreviations: M myopia (nearsighted); A astigmatism; H hyperopia (farsighted); P presbyopia; Mrx spectacle prescription;  CTL contact lenses; OD right eye; OS left eye; OU both eyes  XT exotropia; ET esotropia; PEK punctate epithelial keratitis; PEE punctate epithelial erosions; DES dry eye syndrome; MGD meibomian gland dysfunction; ATs artificial tears; PFAT's preservative free artificial tears; NSC nuclear sclerotic cataract; PSC posterior subcapsular cataract; ERM epi-retinal membrane; PVD posterior vitreous detachment; RD retinal detachment; DM diabetes mellitus; DR diabetic retinopathy; NPDR non-proliferative diabetic retinopathy; PDR proliferative diabetic retinopathy; CSME clinically significant macular edema; DME diabetic macular edema; dbh dot blot hemorrhages; CWS cotton wool spot; POAG primary open angle glaucoma; C/D cup-to-disc ratio; HVF humphrey visual field; GVF goldmann visual field; OCT optical coherence  tomography; IOP intraocular pressure; BRVO Branch retinal vein occlusion; CRVO central retinal vein occlusion; CRAO central retinal artery occlusion; BRAO branch retinal artery occlusion; RT retinal tear; SB scleral buckle; PPV pars plana vitrectomy; VH Vitreous hemorrhage; PRP panretinal laser photocoagulation; IVK intravitreal kenalog; VMT vitreomacular traction; MH Macular hole;  NVD neovascularization of the disc; NVE neovascularization elsewhere; AREDS age related eye disease study; ARMD age related macular degeneration; POAG primary open angle glaucoma; EBMD epithelial/anterior basement membrane dystrophy; ACIOL anterior chamber intraocular lens; IOL intraocular lens; PCIOL posterior chamber intraocular lens; Phaco/IOL phacoemulsification with intraocular lens placement; PRK photorefractive keratectomy; LASIK laser assisted in situ keratomileusis; HTN hypertension; DM diabetes mellitus; COPD chronic obstructive pulmonary disease

## 2021-03-15 ENCOUNTER — Other Ambulatory Visit: Payer: Self-pay | Admitting: Gynecology

## 2021-03-15 DIAGNOSIS — Z1231 Encounter for screening mammogram for malignant neoplasm of breast: Secondary | ICD-10-CM

## 2021-04-11 ENCOUNTER — Ambulatory Visit (INDEPENDENT_AMBULATORY_CARE_PROVIDER_SITE_OTHER): Payer: BC Managed Care – PPO | Admitting: Ophthalmology

## 2021-04-11 ENCOUNTER — Encounter (INDEPENDENT_AMBULATORY_CARE_PROVIDER_SITE_OTHER): Payer: Self-pay | Admitting: Ophthalmology

## 2021-04-11 ENCOUNTER — Other Ambulatory Visit: Payer: Self-pay

## 2021-04-11 DIAGNOSIS — H43813 Vitreous degeneration, bilateral: Secondary | ICD-10-CM

## 2021-04-11 DIAGNOSIS — H4312 Vitreous hemorrhage, left eye: Secondary | ICD-10-CM | POA: Diagnosis not present

## 2021-04-11 NOTE — Assessment & Plan Note (Signed)
Stable no holes or tears ?

## 2021-04-11 NOTE — Progress Notes (Signed)
04/11/2021     CHIEF COMPLAINT Patient presents for  Chief Complaint  Patient presents with   Retina Follow Up      HISTORY OF PRESENT ILLNESS: Melanie Aguirre is a 60 y.o. female who presents to the clinic today for:   HPI     Retina Follow Up           Diagnosis: PVD   Laterality: both eyes   Onset: 7 weeks ago   Severity: mild   Duration: 7 weeks         Comments   7 week f/u with OCT, FP  Pt states the floaters have improved to only be 1 present and not as dark as before. Pt states vision is stable since previous visit. Pt denies any FOL, dark curtain/veil. Pt does c/o tenderness in the brow area of the left eye, improving slowly.  Eye Meds: Pataday QD OU Doxy PRN      Last edited by Frederik Pear, COA on 04/11/2021  8:11 AM.      Referring physician: Marden Noble, MD [address] [place],  [place] [address]  HISTORICAL INFORMATION:   Selected notes from the MEDICAL RECORD NUMBER       CURRENT MEDICATIONS: Current Outpatient Medications (Ophthalmic Drugs)  Medication Sig   Epinastine HCl 0.05 % ophthalmic solution INSTILL 1 DROP INTO BOTH EYES TWICE A DAY AS NEEDED FOR ALLERGY   No current facility-administered medications for this visit. (Ophthalmic Drugs)   Current Outpatient Medications (Other)  Medication Sig   magnesium oxide (MAG-OX) 400 MG tablet Take 400 mg by mouth daily.   Multiple Vitamin (MULTIVITAMIN) tablet Take 1 tablet by mouth daily.   omega-3 acid ethyl esters (LOVAZA) 1 G capsule Take 1 g by mouth daily.    omeprazole (PRILOSEC) 40 MG capsule Take 40 mg by mouth daily. Delayed release,as needed   triamcinolone cream (KENALOG) 0.1 % Apply 1 application topically 2 (two) times daily.    valGANciclovir (VALCYTE) 450 MG tablet Take 450 mg by mouth as needed (for cold sores).    VITAMIN D, CHOLECALCIFEROL, PO Take 2,000 Units by mouth daily.   zolpidem (AMBIEN) 10 MG tablet Take 10 mg by mouth at bedtime as  needed for sleep.    No current facility-administered medications for this visit. (Other)      REVIEW OF SYSTEMS:    ALLERGIES Allergies  Allergen Reactions   Erythromycin     Rash, shortness of breath   Lavender Oil Swelling    Makes eyes swell    PAST MEDICAL HISTORY Past Medical History:  Diagnosis Date   Anemia    as a teenager   Anxiety    last 4 months    Back spasm 1998   episodic severe low back, felt to have probably from lthe L5-S1 area. Usually responds to oxycodone one or two doses.   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-has seen Dr [name] at [facility] and Dr Mariana Kaufman at [facility]   Past Surgical History:  Procedure Laterality Date   ANTERIOR CERVICAL DECOMP/DISCECTOMY FUSION N/A 09/04/2013   Procedure:  ANTERIOR CERVICAL DECOMPRESSION/DISCECTOMY FUSION 1 LEVEL/HARDWARE REMOVAL;  Surgeon: Hewitt Shorts, MD;  Location: [facility];  Service: Neurosurgery;  Laterality: N/A;  Hardware Removal Tether Plate Z6-0   BREAST BIOPSY Right    BREAST SURGERY Right    biopsy (negative)   CERVICAL SPINE SURGERY  04/2010   Dr Lauralyn Primes   INTRACRANIAL ANEURYSM REPAIR  09/2012   coiled-[facility]    FAMILY HISTORY Family History  Problem Relation Age of Onset   Hypertension Father    Congestive Heart Failure Maternal Grandfather    Breast cancer Paternal Grandmother    Cancer Paternal Grandfather        lung, throat    SOCIAL HISTORY Social History   Tobacco Use   Smoking status: Never   Smokeless tobacco: Never  Substance Use Topics   Alcohol use: Yes    Comment: 4-5 times a  week   Drug use: No         OPHTHALMIC EXAM:  Base Eye Exam     Visual Acuity (ETDRS)       Right Left   Dist [place] 20/30 -1 20/25 -2   Dist ph [place] 20/20          Tonometry (Tonopen, 8:21 AM)       Right Left   Pressure 23 22         Pupils       Pupils Dark Light Shape React APD   Right PERRL 5 4 Round Brisk None   Left PERRL 5 4 Round Brisk None         Visual Fields       Left Right    Full Full         Extraocular Movement       Right Left    Full, Ortho Full, Ortho         Neuro/Psych     Oriented x3: Yes   Mood/Affect: Normal         Dilation     Both eyes: 1.0% Mydriacyl, 2.5% Phenylephrine @ 8:21 AM           Slit Lamp and Fundus Exam     External Exam       Right Left   External Normal Normal         Slit Lamp Exam       Right Left   Lids/Lashes Normal Normal   Conjunctiva/Sclera White and quiet White and quiet   Cornea Clear Clear   Anterior Chamber Deep and quiet Deep and quiet   Iris Round and reactive Round and reactive   Lens 1+ Nuclear sclerosis 1+ Nuclear sclerosis   Anterior Vitreous Normal Normal         Fundus Exam       Right Left   Posterior Vitreous Posterior vitreous detachment Posterior vitreous detachment, Central vitreous floaters   Disc Normal Normal   C/D Ratio 0.1 0.1   Macula Normal Normal   Vessels Normal Normal   Periphery Normal Normal, no holes or tears, 20 5D examination scleral depression 360            IMAGING AND PROCEDURES  Imaging and Procedures for 04/11/21  Color Fundus Photography Optos - OU - Both Eyes       Right Eye Progression has no prior data. Disc findings include normal observations. Macula : normal observations. Vessels : normal observations. Periphery : normal observations.   Left Eye Progression has  no prior data. Macula : normal observations. Vessels : normal observations. Periphery : normal observations.   Notes  No further heme on  nerve,  pvd              ASSESSMENT/PLAN:  Posterior vitreous detachment of both eyes Stable no holes or tears  Vitreous hemorrhage of left eye (HCC) Small and associate with posterior vitreous detachment cleared      ICD-10-CM   1. Posterior vitreous detachment of both eyes  H43.813 OCT, Retina - OU - Both Eyes    Color Fundus Photography Optos - OU - Both Eyes    2. Vitreous hemorrhage of left eye (HCC)  H43.12       1.  2.  3.  Ophthalmic Meds Ordered this visit:  No orders of the defined types were placed in this encounter.      Return in about 2 years (around 04/12/2023) for DILATE OU, COLOR FP.  There are no Patient Instructions on file for this visit.   Explained the diagnoses, plan, and follow up with the patient and they expressed understanding.  Patient expressed understanding of the importance of proper follow up care.   Alford Highland Irbin Fines M.D. Diseases & Surgery of the Retina and Vitreous [facility] 04/11/21     Abbreviations: M myopia (nearsighted); A astigmatism; H hyperopia (farsighted); P presbyopia; Mrx spectacle prescription;  CTL contact lenses; OD right eye; OS left eye; OU both eyes  XT exotropia; ET esotropia; PEK punctate epithelial keratitis; PEE punctate epithelial erosions; DES dry eye syndrome; MGD meibomian gland dysfunction; ATs artificial tears; PFAT's preservative free artificial tears; NSC nuclear sclerotic cataract; PSC posterior subcapsular cataract; ERM epi-retinal membrane; PVD posterior vitreous detachment; RD retinal detachment; DM diabetes mellitus; DR diabetic retinopathy; NPDR non-proliferative diabetic retinopathy; PDR proliferative diabetic retinopathy; CSME clinically significant macular edema; DME diabetic macular edema; dbh dot blot hemorrhages; CWS cotton wool spot; POAG primary open angle glaucoma; C/D cup-to-disc ratio; HVF humphrey visual field; GVF goldmann visual field; OCT optical coherence  tomography; IOP intraocular pressure; BRVO Branch retinal vein occlusion; CRVO central retinal vein occlusion; CRAO central retinal artery occlusion; BRAO branch retinal artery occlusion; RT retinal tear; SB scleral buckle; PPV pars plana vitrectomy; VH Vitreous hemorrhage; PRP panretinal laser photocoagulation; IVK intravitreal kenalog; VMT vitreomacular traction; MH Macular hole;  NVD neovascularization of the disc; NVE neovascularization elsewhere; AREDS age related eye disease study; ARMD age related macular degeneration; POAG primary open angle glaucoma; EBMD epithelial/anterior basement membrane dystrophy; ACIOL anterior chamber intraocular lens; IOL intraocular lens; PCIOL posterior chamber intraocular lens; Phaco/IOL phacoemulsification with intraocular lens placement; PRK photorefractive keratectomy; LASIK laser assisted in situ keratomileusis; HTN hypertension; DM diabetes mellitus; COPD chronic obstructive pulmonary disease

## 2021-04-11 NOTE — Assessment & Plan Note (Signed)
Small and associate with posterior vitreous detachment cleared

## 2021-05-05 ENCOUNTER — Other Ambulatory Visit: Payer: Self-pay

## 2021-05-05 ENCOUNTER — Ambulatory Visit
Admission: RE | Admit: 2021-05-05 | Discharge: 2021-05-05 | Disposition: A | Discharge status: Home / Self Care | Payer: BC Managed Care – PPO | Source: Ambulatory Visit | Attending: Gynecology | Admitting: Gynecology

## 2021-05-05 DIAGNOSIS — Z1231 Encounter for screening mammogram for malignant neoplasm of breast: Secondary | ICD-10-CM | POA: Diagnosis not present

## 2021-06-28 DIAGNOSIS — R030 Elevated blood-pressure reading, without diagnosis of hypertension: Secondary | ICD-10-CM | POA: Diagnosis not present

## 2021-06-28 DIAGNOSIS — Z0000 Encounter for general adult medical examination without abnormal findings: Secondary | ICD-10-CM | POA: Diagnosis not present

## 2021-06-28 DIAGNOSIS — E559 Vitamin D deficiency, unspecified: Secondary | ICD-10-CM | POA: Diagnosis not present

## 2021-06-28 DIAGNOSIS — J45998 Other asthma: Secondary | ICD-10-CM | POA: Diagnosis not present

## 2021-07-28 DIAGNOSIS — Z13220 Encounter for screening for lipoid disorders: Secondary | ICD-10-CM | POA: Diagnosis not present

## 2021-07-28 DIAGNOSIS — J45998 Other asthma: Secondary | ICD-10-CM | POA: Diagnosis not present

## 2021-07-28 DIAGNOSIS — E559 Vitamin D deficiency, unspecified: Secondary | ICD-10-CM | POA: Diagnosis not present

## 2021-07-28 DIAGNOSIS — R030 Elevated blood-pressure reading, without diagnosis of hypertension: Secondary | ICD-10-CM | POA: Diagnosis not present

## 2021-10-21 DIAGNOSIS — H5203 Hypermetropia, bilateral: Secondary | ICD-10-CM | POA: Diagnosis not present

## 2021-10-21 DIAGNOSIS — H40053 Ocular hypertension, bilateral: Secondary | ICD-10-CM | POA: Diagnosis not present

## 2022-02-10 DIAGNOSIS — L820 Inflamed seborrheic keratosis: Secondary | ICD-10-CM | POA: Diagnosis not present

## 2022-02-10 DIAGNOSIS — D225 Melanocytic nevi of trunk: Secondary | ICD-10-CM | POA: Diagnosis not present

## 2022-02-10 DIAGNOSIS — Z85828 Personal history of other malignant neoplasm of skin: Secondary | ICD-10-CM | POA: Diagnosis not present

## 2022-02-10 DIAGNOSIS — L821 Other seborrheic keratosis: Secondary | ICD-10-CM | POA: Diagnosis not present

## 2022-02-10 DIAGNOSIS — L570 Actinic keratosis: Secondary | ICD-10-CM | POA: Diagnosis not present

## 2022-03-22 ENCOUNTER — Other Ambulatory Visit: Payer: Self-pay | Admitting: Gynecology

## 2022-03-22 ENCOUNTER — Other Ambulatory Visit: Payer: Self-pay | Admitting: Obstetrics and Gynecology

## 2022-03-22 DIAGNOSIS — Z1231 Encounter for screening mammogram for malignant neoplasm of breast: Secondary | ICD-10-CM

## 2022-05-16 ENCOUNTER — Ambulatory Visit: Payer: BC Managed Care – PPO

## 2022-05-19 ENCOUNTER — Ambulatory Visit
Admission: RE | Admit: 2022-05-19 | Discharge: 2022-05-19 | Disposition: A | Discharge status: Home / Self Care | Payer: BC Managed Care – PPO | Source: Ambulatory Visit | Attending: Obstetrics and Gynecology | Admitting: Obstetrics and Gynecology

## 2022-05-19 DIAGNOSIS — Z1231 Encounter for screening mammogram for malignant neoplasm of breast: Secondary | ICD-10-CM

## 2022-05-24 ENCOUNTER — Other Ambulatory Visit: Payer: Self-pay | Admitting: Obstetrics and Gynecology

## 2022-05-24 ENCOUNTER — Ambulatory Visit
Admission: RE | Admit: 2022-05-24 | Discharge: 2022-05-24 | Disposition: A | Discharge status: Home / Self Care | Payer: BC Managed Care – PPO | Source: Ambulatory Visit | Attending: Obstetrics and Gynecology | Admitting: Obstetrics and Gynecology

## 2022-05-24 DIAGNOSIS — R921 Mammographic calcification found on diagnostic imaging of breast: Secondary | ICD-10-CM

## 2022-05-24 DIAGNOSIS — R928 Other abnormal and inconclusive findings on diagnostic imaging of breast: Secondary | ICD-10-CM | POA: Diagnosis not present

## 2022-06-16 DIAGNOSIS — H40053 Ocular hypertension, bilateral: Secondary | ICD-10-CM | POA: Diagnosis not present

## 2022-06-21 DIAGNOSIS — Z124 Encounter for screening for malignant neoplasm of cervix: Secondary | ICD-10-CM | POA: Diagnosis not present

## 2022-06-21 DIAGNOSIS — Z01419 Encounter for gynecological examination (general) (routine) without abnormal findings: Secondary | ICD-10-CM | POA: Diagnosis not present

## 2022-06-21 DIAGNOSIS — Z6825 Body mass index (BMI) 25.0-25.9, adult: Secondary | ICD-10-CM | POA: Diagnosis not present

## 2022-07-10 ENCOUNTER — Other Ambulatory Visit: Payer: Self-pay | Admitting: Internal Medicine

## 2022-07-10 DIAGNOSIS — Z0000 Encounter for general adult medical examination without abnormal findings: Secondary | ICD-10-CM | POA: Diagnosis not present

## 2022-07-10 DIAGNOSIS — E7800 Pure hypercholesterolemia, unspecified: Secondary | ICD-10-CM

## 2022-07-20 ENCOUNTER — Other Ambulatory Visit: Payer: Self-pay | Admitting: Obstetrics and Gynecology

## 2022-07-20 DIAGNOSIS — Z13820 Encounter for screening for osteoporosis: Secondary | ICD-10-CM

## 2022-07-25 DIAGNOSIS — E559 Vitamin D deficiency, unspecified: Secondary | ICD-10-CM | POA: Diagnosis not present

## 2022-07-25 DIAGNOSIS — R030 Elevated blood-pressure reading, without diagnosis of hypertension: Secondary | ICD-10-CM | POA: Diagnosis not present

## 2022-07-25 DIAGNOSIS — R635 Abnormal weight gain: Secondary | ICD-10-CM | POA: Diagnosis not present

## 2022-07-25 DIAGNOSIS — E7800 Pure hypercholesterolemia, unspecified: Secondary | ICD-10-CM | POA: Diagnosis not present

## 2022-09-05 ENCOUNTER — Other Ambulatory Visit: Payer: Self-pay | Admitting: Obstetrics and Gynecology

## 2022-09-05 DIAGNOSIS — Z13820 Encounter for screening for osteoporosis: Secondary | ICD-10-CM

## 2022-10-23 ENCOUNTER — Other Ambulatory Visit: Payer: BC Managed Care – PPO

## 2022-11-09 ENCOUNTER — Encounter (INDEPENDENT_AMBULATORY_CARE_PROVIDER_SITE_OTHER): Payer: BC Managed Care – PPO | Admitting: Ophthalmology

## 2022-12-01 ENCOUNTER — Other Ambulatory Visit: Payer: BC Managed Care – PPO

## 2022-12-28 DIAGNOSIS — H40053 Ocular hypertension, bilateral: Secondary | ICD-10-CM | POA: Diagnosis not present

## 2023-01-30 ENCOUNTER — Other Ambulatory Visit: Payer: BC Managed Care – PPO

## 2023-03-05 ENCOUNTER — Ambulatory Visit
Admission: RE | Admit: 2023-03-05 | Discharge: 2023-03-05 | Disposition: A | Discharge status: Home / Self Care | Payer: BC Managed Care – PPO | Source: Ambulatory Visit | Attending: Obstetrics and Gynecology | Admitting: Obstetrics and Gynecology

## 2023-03-05 DIAGNOSIS — M8588 Other specified disorders of bone density and structure, other site: Secondary | ICD-10-CM | POA: Diagnosis not present

## 2023-03-05 DIAGNOSIS — Z13820 Encounter for screening for osteoporosis: Secondary | ICD-10-CM

## 2023-03-05 DIAGNOSIS — N958 Other specified menopausal and perimenopausal disorders: Secondary | ICD-10-CM | POA: Diagnosis not present

## 2023-03-05 DIAGNOSIS — E349 Endocrine disorder, unspecified: Secondary | ICD-10-CM | POA: Diagnosis not present

## 2023-03-08 ENCOUNTER — Inpatient Hospital Stay: Admission: RE | Admit: 2023-03-08 | Payer: BC Managed Care – PPO | Source: Ambulatory Visit

## 2023-04-05 ENCOUNTER — Ambulatory Visit
Admission: RE | Admit: 2023-04-05 | Discharge: 2023-04-05 | Disposition: A | Discharge status: Home / Self Care | Payer: No Typology Code available for payment source | Source: Ambulatory Visit | Attending: Internal Medicine | Admitting: Internal Medicine

## 2023-04-05 DIAGNOSIS — E7800 Pure hypercholesterolemia, unspecified: Secondary | ICD-10-CM

## 2023-04-06 ENCOUNTER — Other Ambulatory Visit: Payer: Self-pay | Admitting: Obstetrics and Gynecology

## 2023-04-06 DIAGNOSIS — Z1231 Encounter for screening mammogram for malignant neoplasm of breast: Secondary | ICD-10-CM

## 2023-04-27 ENCOUNTER — Telehealth (HOSPITAL_BASED_OUTPATIENT_CLINIC_OR_DEPARTMENT_OTHER): Payer: Self-pay | Admitting: *Deleted

## 2023-04-27 NOTE — Telephone Encounter (Signed)
Patient needs new patient added to reader day per Dr Cristal Deer, prevention clinic  She does have appointment with Eula Fried NP 9/18,   Left message to call back

## 2023-05-01 NOTE — Telephone Encounter (Signed)
Returned call to patient, patient rescheduled with Dr. Cristal Deer!

## 2023-05-01 NOTE — Telephone Encounter (Signed)
Patient is returning call and is requesting return call.

## 2023-05-02 DIAGNOSIS — H01023 Squamous blepharitis right eye, unspecified eyelid: Secondary | ICD-10-CM | POA: Diagnosis not present

## 2023-05-02 DIAGNOSIS — H04123 Dry eye syndrome of bilateral lacrimal glands: Secondary | ICD-10-CM | POA: Diagnosis not present

## 2023-05-02 DIAGNOSIS — H43813 Vitreous degeneration, bilateral: Secondary | ICD-10-CM | POA: Diagnosis not present

## 2023-05-02 DIAGNOSIS — H01026 Squamous blepharitis left eye, unspecified eyelid: Secondary | ICD-10-CM | POA: Diagnosis not present

## 2023-05-07 DIAGNOSIS — L570 Actinic keratosis: Secondary | ICD-10-CM | POA: Diagnosis not present

## 2023-05-07 DIAGNOSIS — I788 Other diseases of capillaries: Secondary | ICD-10-CM | POA: Diagnosis not present

## 2023-05-07 DIAGNOSIS — D225 Melanocytic nevi of trunk: Secondary | ICD-10-CM | POA: Diagnosis not present

## 2023-05-07 DIAGNOSIS — Z85828 Personal history of other malignant neoplasm of skin: Secondary | ICD-10-CM | POA: Diagnosis not present

## 2023-05-09 ENCOUNTER — Institutional Professional Consult (permissible substitution) (HOSPITAL_BASED_OUTPATIENT_CLINIC_OR_DEPARTMENT_OTHER): Payer: BC Managed Care – PPO | Admitting: Nurse Practitioner

## 2023-05-14 ENCOUNTER — Ambulatory Visit (HOSPITAL_BASED_OUTPATIENT_CLINIC_OR_DEPARTMENT_OTHER): Payer: BC Managed Care – PPO | Admitting: Cardiology

## 2023-05-22 ENCOUNTER — Ambulatory Visit
Admission: RE | Admit: 2023-05-22 | Discharge: 2023-05-22 | Disposition: A | Discharge status: Home / Self Care | Payer: BC Managed Care – PPO | Source: Ambulatory Visit | Attending: Obstetrics and Gynecology | Admitting: Obstetrics and Gynecology

## 2023-05-22 DIAGNOSIS — Z1231 Encounter for screening mammogram for malignant neoplasm of breast: Secondary | ICD-10-CM

## 2023-05-30 ENCOUNTER — Encounter (HOSPITAL_BASED_OUTPATIENT_CLINIC_OR_DEPARTMENT_OTHER): Payer: Self-pay | Admitting: Cardiology

## 2023-05-30 ENCOUNTER — Ambulatory Visit (HOSPITAL_BASED_OUTPATIENT_CLINIC_OR_DEPARTMENT_OTHER): Payer: BC Managed Care – PPO | Admitting: Cardiology

## 2023-05-30 VITALS — BP 126/78 | HR 83 | Ht 66.0 in | Wt 160.9 lb

## 2023-05-30 DIAGNOSIS — Z712 Person consulting for explanation of examination or test findings: Secondary | ICD-10-CM | POA: Diagnosis not present

## 2023-05-30 DIAGNOSIS — E7800 Pure hypercholesterolemia, unspecified: Secondary | ICD-10-CM | POA: Diagnosis not present

## 2023-05-30 DIAGNOSIS — Z7189 Other specified counseling: Secondary | ICD-10-CM

## 2023-05-30 DIAGNOSIS — I2510 Atherosclerotic heart disease of native coronary artery without angina pectoris: Secondary | ICD-10-CM

## 2023-05-30 DIAGNOSIS — Z136 Encounter for screening for cardiovascular disorders: Secondary | ICD-10-CM | POA: Diagnosis not present

## 2023-05-30 MED ORDER — ASPIRIN 81 MG PO TBEC: 81.0000 mg | DELAYED_RELEASE_TABLET | Freq: Every day | ORAL | Status: AC

## 2023-05-30 NOTE — Progress Notes (Signed)
Cardiology Office Note:  .    Date:  05/30/2023  ID:  Melanie Aguirre, DOB [birth year], MRN [id number] PCP: Thana Ates, MD  [facility] Providers Cardiologist:  Jodelle Red, MD     History of Present Illness: Melanie Aguirre is a 62 y.o. female with a hx of CAD, hyperlipidemia, nonruptured cerebral aneurysm s/p clamping, vitamin D deficiency, seasonal asthma, osteopenia, here for the evaluation of CAD.   Referral notes from Dr. Hillard Danker personally reviewed. She was seen by Dr. Margaretann Loveless 07/10/2022 and requested calcium scoring given a family history of CAD in her father. She had a coronary CT 03/2023 revealing a coronary calcium score of 103 all in the LAD and was started on rosuvastatin 5 mg.   Today, she reports developing a COVID infection last week. She presents wearing a mask and did recently test negative for COVID.  Cardiovascular risk factors: Prior clinical ASCVD: She had a coronary CT 03/2023 revealing a coronary calcium score of 103 in LAD and was started on statin therapy. Comorbid conditions:  Hyperlipidemia - Started on rosuvastatin 5 mg 04/09/2023 following her coronary CT. Currently she is scheduled next week for repeat lipid panel with LPa. Metabolic syndrome/Obesity:  Current weight 160 lbs. Chronic inflammatory conditions: None Tobacco use history:  Never. Family history: CAD in her father, who also had pacemaker/defibrillator implant. 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 Prior pertinent testing and/or incidental findings: She had normal carotid dopplers 07/2013 with no evidence of stenosis. History of nonruptured cerebral aneurysm s/p clamping; seen by Dr. Porfirio Mylar Dohmeier at [facility]. Notes that her double vision at the time did not resolve immediately after treatment.  Exercise level: She used to exercise more consistently, has recently not been exercising as frequently.  When she exercises, her heart rate sometimes becomes elevated to 160-165 bpm. Has also occurred while walking uphill in hot weather. Current diet/supplementation: Generally follows a healthy diet. Occasional alcohol consumption. Additionally she notes that her most recent Dexa scan showed a diagnosis of osteopenia. She takes a Centrum multivitamin daily. It has been years since she last took the Lovaza. Recently started tumeric for joint inflammation, particularly in her fingers. Her symptoms subsequently improved.  She denies any chest pain, shortness of breath, peripheral edema, lightheadedness, headaches, syncope, orthopnea, or PND.  ROS:  Please see the history of present illness. ROS otherwise negative except as noted.   Studies Reviewed: Marland Kitchen    EKG Interpretation Date/Time:  Wednesday May 30 2023 40:98:11 EDT Ventricular Rate:  83 PR Interval:  150 QRS Duration:  78 QT Interval:  376 QTC Calculation: 441 R Axis:   72  Text Interpretation: Normal sinus rhythm Confirmed by Jodelle Red [phone]) on 05/30/2023 9:33:58 AM    CT Cardiac Scoring  04/05/2023: IMPRESSION: Total Agatston score: 101   [place] database percentile: 83   No acute or significant extracardiac abnormality.  Bilateral Carotid Dopplers  08/08/2013: IMPRESSION:  1. Negative for significant carotid plaque or stenosis. No evidence  of mass.   Physical Exam:    VS:  BP 126/78   Pulse 83   Ht 5\' 6"  (1.676 m)   Wt 160 lb 14.4 oz (73 kg)   SpO2 98%   BMI 25.97 kg/m    Wt Readings from Last 3 Encounters:  05/30/23 160 lb 14.4 oz (73 kg)  01/23/18 161 lb (73 kg)  01/22/17 164 lb (  74.4 kg)    GEN: Well nourished, well developed in no acute distress HEENT: Normal, moist mucous membranes NECK: No JVD CARDIAC: regular rhythm, normal S1 and S2, no rubs or gallops. No murmur. VASCULAR: Radial and DP pulses 2+ bilaterally. No carotid bruits RESPIRATORY:  Clear to auscultation without rales, wheezing or  rhonchi  ABDOMEN: Soft, non-tender, non-distended MUSCULOSKELETAL:  Ambulates independently SKIN: Warm and dry, no edema NEUROLOGIC:  Alert and oriented x 3. No focal neuro deficits noted. PSYCHIATRIC:  Normal affect   ASSESSMENT AND PLAN: .    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 Discussed aspirin and statin. She will start aspirin 81 mg daily. She is currently taking rosuvastatin 5 mg daily. Ordered repeat fasting lipid panel -Discussed checking Lpa, she is amenable -reviewed red flag warning signs that need immediate medical attention  CV risk counseling and prevention -recommend heart healthy/Mediterranean diet, with whole grains, fruits, vegetable, fish, lean meats, nuts, and olive oil. Limit salt. -recommend moderate walking, 3-5 times/week for 30-50 minutes each session. Aim for at least 150 minutes.week. Goal should be pace of 3 miles/hours, or walking 1.5 miles in 30 minutes -recommend avoidance of tobacco products. Avoid excess alcohol.  Dispo: Follow-up in 1 year, or sooner as needed.  I,Mathew Stumpf,acting as a Neurosurgeon for [employer], MD.,have documented all relevant documentation on the behalf of Jodelle Red, MD,as directed by  Jodelle Red, MD while in the presence of Jodelle Red, MD.  I, Jodelle Red, MD, have reviewed all documentation for this visit. The documentation on 06/27/23 for the exam, diagnosis, procedures, and orders are all accurate and complete.   Signed, Jodelle Red, MD

## 2023-05-30 NOTE — Patient Instructions (Addendum)
Medication Instructions:  START ASPIRIN 81 MG DAILY   *If you need a refill on your cardiac medications before your next appointment, please call your pharmacy*  Lab Work: FASTING LP/LPa SOON   If you have labs (blood work) drawn today and your tests are completely normal, you will receive your results only by: MyChart Message (if you have MyChart) OR A paper copy in the mail If you have any lab test that is abnormal or we need to change your treatment, we will call you to review the results.  Testing/Procedures: NONE  Follow-Up: At [facility], you and your health needs are our priority.  As part of our continuing mission to provide you with exceptional heart care, we have created designated Provider Care Teams.  These Care Teams include your primary Cardiologist (physician) and Advanced Practice Providers (APPs -  Physician Assistants and Nurse Practitioners) who all work together to provide you with the care you need, when you need it.  We recommend signing up for the patient portal called "MyChart".  Sign up information is provided on this After Visit Summary.  MyChart is used to connect with patients for Virtual Visits (Telemedicine).  Patients are able to view lab/test results, encounter notes, upcoming appointments, etc.  Non-urgent messages can be sent to your provider as well.   To learn more about what you can do with MyChart, go to [email or web address].    Your next appointment:   12 month(s)  Provider:   Jodelle Red, MD or Gillian Shields, NP

## 2023-06-03 ENCOUNTER — Encounter (HOSPITAL_BASED_OUTPATIENT_CLINIC_OR_DEPARTMENT_OTHER): Payer: Self-pay

## 2023-06-05 MED ORDER — ROSUVASTATIN CALCIUM 5 MG PO TABS: 5.0000 mg | ORAL_TABLET | Freq: Every day | ORAL | 6 refills | Status: DC

## 2023-06-25 DIAGNOSIS — H40053 Ocular hypertension, bilateral: Secondary | ICD-10-CM | POA: Diagnosis not present

## 2023-07-09 DIAGNOSIS — Z01419 Encounter for gynecological examination (general) (routine) without abnormal findings: Secondary | ICD-10-CM | POA: Diagnosis not present

## 2023-07-09 DIAGNOSIS — Z6825 Body mass index (BMI) 25.0-25.9, adult: Secondary | ICD-10-CM | POA: Diagnosis not present

## 2023-07-23 DIAGNOSIS — Z23 Encounter for immunization: Secondary | ICD-10-CM | POA: Diagnosis not present

## 2023-07-23 DIAGNOSIS — I2510 Atherosclerotic heart disease of native coronary artery without angina pectoris: Secondary | ICD-10-CM | POA: Diagnosis not present

## 2023-07-23 DIAGNOSIS — J45998 Other asthma: Secondary | ICD-10-CM | POA: Diagnosis not present

## 2023-07-23 DIAGNOSIS — E559 Vitamin D deficiency, unspecified: Secondary | ICD-10-CM | POA: Diagnosis not present

## 2023-07-23 DIAGNOSIS — M5412 Radiculopathy, cervical region: Secondary | ICD-10-CM | POA: Diagnosis not present

## 2023-08-02 DIAGNOSIS — E559 Vitamin D deficiency, unspecified: Secondary | ICD-10-CM | POA: Diagnosis not present

## 2023-08-02 DIAGNOSIS — E7800 Pure hypercholesterolemia, unspecified: Secondary | ICD-10-CM | POA: Diagnosis not present

## 2023-08-20 DIAGNOSIS — Z136 Encounter for screening for cardiovascular disorders: Secondary | ICD-10-CM | POA: Diagnosis not present

## 2023-08-20 DIAGNOSIS — I2510 Atherosclerotic heart disease of native coronary artery without angina pectoris: Secondary | ICD-10-CM | POA: Diagnosis not present

## 2023-08-21 LAB — LIPID PANEL
Chol/HDL Ratio: 2.2 {ratio} (ref 0.0–4.4)
Cholesterol, Total: 204 mg/dL — ABNORMAL HIGH (ref 100–199)
HDL: 91 mg/dL (ref >39)
LDL Chol Calc (NIH): 96 mg/dL (ref 0–99)
Triglycerides: 95 mg/dL (ref 0–149)
VLDL Cholesterol Cal: 17 mg/dL (ref 5–40)

## 2023-08-21 LAB — LIPOPROTEIN A (LPA): Lipoprotein (a): 23.1 nmol/L (ref <75.0)

## 2023-08-23 ENCOUNTER — Encounter (HOSPITAL_BASED_OUTPATIENT_CLINIC_OR_DEPARTMENT_OTHER): Payer: Self-pay

## 2023-08-23 DIAGNOSIS — E7800 Pure hypercholesterolemia, unspecified: Secondary | ICD-10-CM

## 2023-08-23 MED ORDER — ROSUVASTATIN CALCIUM 10 MG PO TABS: 10.0000 mg | ORAL_TABLET | Freq: Every day | ORAL | 1 refills | Status: DC

## 2023-11-15 ENCOUNTER — Ambulatory Visit: Payer: BC Managed Care – PPO | Admitting: Neurology

## 2023-11-15 ENCOUNTER — Encounter: Payer: Self-pay | Admitting: Neurology

## 2023-11-15 VITALS — BP 126/69 | HR 78 | Ht 66.0 in | Wt 162.0 lb

## 2023-11-15 DIAGNOSIS — H5702 Anisocoria: Secondary | ICD-10-CM | POA: Diagnosis not present

## 2023-11-15 DIAGNOSIS — Z8669 Personal history of other diseases of the nervous system and sense organs: Secondary | ICD-10-CM | POA: Diagnosis not present

## 2023-11-15 DIAGNOSIS — I671 Cerebral aneurysm, nonruptured: Secondary | ICD-10-CM

## 2023-11-15 DIAGNOSIS — G514 Facial myokymia: Secondary | ICD-10-CM | POA: Diagnosis not present

## 2023-11-15 NOTE — Progress Notes (Signed)
 Provider:  [name], MD  Primary Care Physician:  Thana Ates, MD 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 female patient who is seen upon referral on 11/15/2023 from her new internist  for a new evaluation of  diplopia, her known unruptured brain aneurysm, had surgery within 48 ours. She had seen me from 2015-2020- for transient Diplopia, and scintillating anisocoria, vision changes and visible  abnormality of the pupil.    Seen by [name],  and sent from there to [place], [place] .  The intermittent pupillary pulsation and diplopia was never solved.  Its very rarely occurring now.    Chief concern according to patient :  " my new MDs are all urging me to see my neurologist again, my old docs all are retired".  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    Review of Systems: Out of a complete 14 system review, the patient complains of only the following symptoms, and all other reviewed systems are negative.:     Social History   Socioeconomic History   Marital status: Married    Spouse name: [name]   Number of children: 3   Years of education: BA3   Highest education level: Not on file  Occupational History   Occupation: not employed  Tobacco Use   Smoking status: Never   Smokeless tobacco: Never  Substance and Sexual Activity   Alcohol use: Yes    Comment: 4-5 times a  week   Drug use: No   Sexual activity: Not on file  Other Topics Concern   Not on file  Social History Narrative   Patient lives at home husband Melanie Aguirre.    Patient has 3 children.    Patient is retired.    Patient has a college degree.    Patient is right-handed.   Patient drinks 2-3 cups of coffee and tea sometimes but not everyday.   Social Drivers of Corporate investment banker Strain: Not on file  Food Insecurity: Not on file  Transportation Needs: Not on file  Physical Activity: Not on file  Stress: Not on file  Social Connections: Not on file    Family History  Problem Relation Age of Onset   Hypertension Father    Congestive Heart Failure Maternal Grandfather    Breast cancer Paternal Grandmother 29 - 62   Cancer Paternal Grandfather        lung, throat    Past Medical History:  Diagnosis Date   Anemia    as a teenager   Anxiety    last 4 months    Arteriosclerotic coronary artery disease    Axillary lymphadenopathy    Back spasm 08/21/1996   episodic severe low  back, felt to have probably from lthe L5-S1 area. Usually responds to oxycodone one or two doses.   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-has seen Dr [name] at [facility] and Dr Mariana Kaufman at [facility]   Vitamin D deficiency     Past Surgical History:  Procedure Laterality Date   ANTERIOR CERVICAL DECOMP/DISCECTOMY FUSION N/A 09/04/2013   Procedure: ANTERIOR CERVICAL  DECOMPRESSION/DISCECTOMY FUSION 1 LEVEL/HARDWARE REMOVAL;  Surgeon: Hewitt Shorts, MD;  Location: [facility];  Service: Neurosurgery;  Laterality: N/A;  Hardware Removal Tether Plate U9-8   BREAST BIOPSY Right    BREAST SURGERY Right    biopsy (negative)   CERVICAL SPINE SURGERY  04/2010   Dr Lauralyn Primes   INTRACRANIAL ANEURYSM REPAIR  09/2012   [facility]iled-[facility]     Current Outpatient Medications on File Prior to Visit  Medication Sig Dispense Refill   aspirin EC 81 MG tablet Take 1 tablet (81 mg total) by mouth daily. Swallow whole.     Epinastine HCl 0.05 % ophthalmic solution INSTILL 1 DROP INTO BOTH EYES TWICE A DAY AS NEEDED FOR ALLERGY     fluticasone furoate-vilanterol (BREO ELLIPTA) 200-25 MCG/ACT AEPB Inhale 1 puff into the lungs as needed.     magnesium oxide (MAG-OX) 400 MG tablet Take 400 mg by mouth daily.     Multiple Vitamin (MULTIVITAMIN) tablet Take 1 tablet by mouth daily.     rosuvastatin (CRESTOR) 10 MG tablet Take 1 tablet (10 mg total) by mouth daily. 90 tablet 1   triamcinolone cream (KENALOG) 0.1 % Apply 1 application  topically as needed.     valGANciclovir (VALCYTE) 450 MG tablet Take 450 mg by mouth as needed (for cold sores).      VITAMIN D, CHOLECALCIFEROL, PO Take 2,000 Units by mouth daily.     zolpidem (AMBIEN) 10 MG tablet Take 10 mg by mouth at bedtime as needed for sleep.      No current facility-administered medications on file prior to visit.    Allergies  Allergen Reactions   Erythromycin     Rash, shortness of breath   Lavender Oil Swelling    Makes eyes swell     DIAGNOSTIC DATA (LABS, IMAGING, TESTING) - I reviewed patient records, labs, notes, testing and imaging myself where available.  Lab Results  Component Value Date   WBC 5.8 09/03/2013   HGB 13.8 09/03/2013   HCT 40.2 09/03/2013   MCV 93.9 09/03/2013   PLT 318 09/03/2013      Component Value Date/Time   NA 138 09/03/2013 1103   NA 137  07/24/2013 0906   K 4.6 09/03/2013 1103   CL 98 09/03/2013 1103   CO2 27 09/03/2013 1103   GLUCOSE 97 09/03/2013 1103   BUN 20 09/03/2013 1103   BUN 15 07/24/2013 0906   CREATININE 0.71 09/03/2013 1103   CALCIUM 9.9  09/03/2013 1103   PROT 7.6 07/24/2013 0906   ALBUMIN 4.4 07/24/2013 0906   AST 26 07/24/2013 0906   ALT 34 (H) 07/24/2013 0906   ALKPHOS 71 07/24/2013 0906   BILITOT 0.3 07/24/2013 0906   GFRNONAA >90 09/03/2013 1103   GFRAA >90 09/03/2013 1103   Lab Results  Component Value Date   CHOL 204 (H) 08/20/2023   HDL 91 08/20/2023   LDLCALC 96 08/20/2023   TRIG 95 08/20/2023   CHOLHDL 2.2 08/20/2023   No results found for: "HGBA1C" No results found for: "VITAMINB12" Lab Results  Component Value Date   TSH 1.624 09/11/2012    PHYSICAL EXAM:  Today's Vitals   11/15/23 1422  BP: 126/69  Pulse: 78  Weight: 162 lb (73.5 kg)  Height: 5\' 6"  (1.676 m)   Body mass index is 26.15 kg/m.   Wt Readings from Last 3 Encounters:  11/15/23 162 lb (73.5 kg)  05/30/23 160 lb 14.4 oz (73 kg)  01/23/18 161 lb (73 kg)     Ht Readings from Last 3 Encounters:  11/15/23 5\' 6"  (1.676 m)  05/30/23 5\' 6"  (1.676 m)  01/23/18 5\' 6"  (1.676 m)      General: The patient is awake, alert and appears not in acute distress. The patient is well groomed. Head: Normocephalic, atraumatic. Neck is supple. General: The patient is awake, alert and appears not in acute distress. The patient is well groomed. Head: Normocephalic, atraumatic. Neck mobility restricted  due to ant cervical fusion- .  Mallampati 2-3  neck circumference: 14.25.  Cardiovascular:  Regular rate and rhythm , no carotid bruit, and without distended neck veins. No pulsation to her tinnitus.     Respiratory: Lungs are clear to auscultation. Skin:  Without evidence of edema, or rash.  Trunk: BMI is elevated and patient has a normal posture. Neurologic exam :Speech is fluent without dysarthria, dysphonia or aphasia.   Mood and affect are appropriate. Cranial nerves: 3  mm pupil , round and constricting -   No ptosis today and no deviation.   Facial sensation intact to fine touch. Facial motor strength is symmetric. Tongue and uvula move midline. Motor exam:  Normal tone, muscle bulk and symmetric normal strength in all extremities.  Her initial right had grip strength is intact but she cannot maintain, pronator weakness in the right arm. Finger-to-nose maneuver tested and normal without evidence of ataxia, dysmetria or tremor. Gait and station: Patient could rise unassisted from a seated position, walked without assistive device.  Toe and heel walk were deferred.  Deep tendon reflexes: in the  upper and lower extremities are symmetric and intact.    Gaze deviation causing and dilation of the pupil on the left - not seen today .   The patient feels a spasm in the eye. I  can not explain these symptoms, but they in her last visits (  >5 years ago ) were objectively present.  The symptoms have improved , no recurrence and again I lack an explanation for it but  i am happy for her.       ASSESSMENT AND PLAN 63 y.o. year old female  here with:    1)  unchanged  neuro-ophthalmological status,   I will order her an MRI brain and angio with and without contrast , comparison study 2017 and 2019    I plan to follow up either personally or through our NP within 12 months.   I would like to thank Hillard Danker  Demetrius Charity, MD and Ranae Pila, Md [address] [place],  [place] [address] for allowing me to meet with and to take care of this pleasant patient.     After spending a total time of  35  minutes face to face and additional time for physical and neurologic examination, review of laboratory studies,  personal review of imaging studies, reports and results of other testing and review of referral information / records as far as provided in visit,   Electronically signed by: [name],  MD 11/15/2023 2:50 PM  [facility] Board certified by The [employer] of Sleep Medicine and Diplomate of the [employer] of Sleep Medicine. Board certified In Neurology through the ABPN, Fellow of the [employer] of Neu[employer].

## 2023-11-15 NOTE — Addendum Note (Signed)
 Addended by: [name] on: 11/15/2023 03:22 PM   Modules accepted: Orders

## 2023-11-16 ENCOUNTER — Encounter: Payer: Self-pay | Admitting: Neurology

## 2023-11-16 LAB — COMPREHENSIVE METABOLIC PANEL WITH GFR
ALT: 29 IU/L (ref 0–32)
AST: 31 IU/L (ref 0–40)
Albumin: 4.4 g/dL (ref 3.9–4.9)
Alkaline Phosphatase: 70 IU/L (ref 44–121)
BUN/Creatinine Ratio: 19 (ref 12–28)
BUN: 14 mg/dL (ref 8–27)
Bilirubin Total: 0.3 mg/dL (ref 0.0–1.2)
CO2: 23 mmol/L (ref 20–29)
Calcium: 9.6 mg/dL (ref 8.7–10.3)
Chloride: 99 mmol/L (ref 96–106)
Creatinine, Ser: 0.75 mg/dL (ref 0.57–1.00)
Globulin, Total: 2.6 g/dL (ref 1.5–4.5)
Glucose: 87 mg/dL (ref 70–99)
Potassium: 4.3 mmol/L (ref 3.5–5.2)
Sodium: 136 mmol/L (ref 134–144)
Total Protein: 7 g/dL (ref 6.0–8.5)
eGFR: 89 mL/min/{1.73_m2} (ref >59)

## 2023-11-19 ENCOUNTER — Telehealth: Payer: Self-pay

## 2023-11-19 NOTE — Telephone Encounter (Signed)
 Called pt and informed her per Dr Dohmeier "Absolutely fabulous metabolic panel ! Can go for  contrasted brain MRI MRA." Pt verbalized understanding. Pt had no questions at this time but was encouraged to call back if questions arise.

## 2023-11-19 NOTE — Telephone Encounter (Signed)
-----   Message from Richwood Dohmeier sent at 11/16/2023  2:24 PM EDT ----- Absolutely fabulous metabolic panel ! Can go for  contrasted brain MRI MRA.

## 2023-11-22 ENCOUNTER — Telehealth: Payer: Self-pay | Admitting: Neurology

## 2023-11-22 NOTE — Telephone Encounter (Signed)
[employer] no auth required via [employer] sent to GI [phone] 

## 2023-12-12 DIAGNOSIS — R059 Cough, unspecified: Secondary | ICD-10-CM | POA: Diagnosis not present

## 2023-12-19 DIAGNOSIS — R053 Chronic cough: Secondary | ICD-10-CM | POA: Diagnosis not present

## 2024-01-28 DIAGNOSIS — H524 Presbyopia: Secondary | ICD-10-CM | POA: Diagnosis not present

## 2024-01-28 DIAGNOSIS — H40053 Ocular hypertension, bilateral: Secondary | ICD-10-CM | POA: Diagnosis not present

## 2024-02-05 DIAGNOSIS — H1089 Other conjunctivitis: Secondary | ICD-10-CM | POA: Diagnosis not present

## 2024-02-13 ENCOUNTER — Other Ambulatory Visit (HOSPITAL_BASED_OUTPATIENT_CLINIC_OR_DEPARTMENT_OTHER): Payer: Self-pay | Admitting: Family

## 2024-02-14 ENCOUNTER — Other Ambulatory Visit (HOSPITAL_COMMUNITY): Payer: Self-pay | Admitting: Internal Medicine

## 2024-02-14 DIAGNOSIS — E7800 Pure hypercholesterolemia, unspecified: Secondary | ICD-10-CM | POA: Diagnosis not present

## 2024-02-15 ENCOUNTER — Ambulatory Visit (HOSPITAL_BASED_OUTPATIENT_CLINIC_OR_DEPARTMENT_OTHER): Payer: Self-pay | Admitting: Family

## 2024-02-15 LAB — HEPATIC FUNCTION PANEL
ALT: 23 IU/L (ref 0–32)
AST: 24 IU/L (ref 0–40)
Albumin: 4.4 g/dL (ref 3.9–4.9)
Alkaline Phosphatase: 71 IU/L (ref 44–121)
Bilirubin Total: 0.3 mg/dL (ref 0.0–1.2)
Bilirubin, Direct: 0.14 mg/dL (ref 0.00–0.40)
Total Protein: 7.1 g/dL (ref 6.0–8.5)

## 2024-02-15 LAB — LIPID PANEL
Chol/HDL Ratio: 1.8 ratio (ref 0.0–4.4)
Cholesterol, Total: 177 mg/dL (ref 100–199)
HDL: 97 mg/dL (ref >39)
LDL Chol Calc (NIH): 66 mg/dL (ref 0–99)
Triglycerides: 74 mg/dL (ref 0–149)
VLDL Cholesterol Cal: 14 mg/dL (ref 5–40)

## 2024-02-15 MED ORDER — ROSUVASTATIN CALCIUM 10 MG PO TABS: 10.0000 mg | ORAL_TABLET | Freq: Every day | ORAL | 3 refills | Status: AC

## 2024-04-17 DIAGNOSIS — R300 Dysuria: Secondary | ICD-10-CM | POA: Diagnosis not present

## 2024-05-01 ENCOUNTER — Other Ambulatory Visit: Payer: Self-pay | Admitting: Obstetrics and Gynecology

## 2024-05-01 DIAGNOSIS — Z1231 Encounter for screening mammogram for malignant neoplasm of breast: Secondary | ICD-10-CM

## 2024-05-21 ENCOUNTER — Other Ambulatory Visit: Payer: Self-pay | Admitting: Medical Genetics

## 2024-05-23 ENCOUNTER — Ambulatory Visit
Admission: RE | Admit: 2024-05-23 | Discharge: 2024-05-23 | Disposition: A | Discharge status: Home / Self Care | Source: Ambulatory Visit | Attending: Obstetrics and Gynecology | Admitting: Obstetrics and Gynecology

## 2024-05-23 DIAGNOSIS — Z1231 Encounter for screening mammogram for malignant neoplasm of breast: Secondary | ICD-10-CM

## 2024-06-23 DIAGNOSIS — H01023 Squamous blepharitis right eye, unspecified eyelid: Secondary | ICD-10-CM | POA: Diagnosis not present

## 2024-06-23 DIAGNOSIS — H43813 Vitreous degeneration, bilateral: Secondary | ICD-10-CM | POA: Diagnosis not present

## 2024-06-23 DIAGNOSIS — H01026 Squamous blepharitis left eye, unspecified eyelid: Secondary | ICD-10-CM | POA: Diagnosis not present

## 2024-06-23 DIAGNOSIS — H04123 Dry eye syndrome of bilateral lacrimal glands: Secondary | ICD-10-CM | POA: Diagnosis not present

## 2024-07-01 ENCOUNTER — Other Ambulatory Visit

## 2024-07-03 ENCOUNTER — Other Ambulatory Visit

## 2024-07-03 DIAGNOSIS — Z006 Encounter for examination for normal comparison and control in clinical research program: Secondary | ICD-10-CM

## 2024-07-09 DIAGNOSIS — Z6825 Body mass index (BMI) 25.0-25.9, adult: Secondary | ICD-10-CM | POA: Diagnosis not present

## 2024-07-09 DIAGNOSIS — Z01419 Encounter for gynecological examination (general) (routine) without abnormal findings: Secondary | ICD-10-CM | POA: Diagnosis not present

## 2024-07-21 DIAGNOSIS — H40053 Ocular hypertension, bilateral: Secondary | ICD-10-CM | POA: Diagnosis not present

## 2024-07-21 DIAGNOSIS — H2513 Age-related nuclear cataract, bilateral: Secondary | ICD-10-CM | POA: Diagnosis not present

## 2024-07-28 DIAGNOSIS — Z0000 Encounter for general adult medical examination without abnormal findings: Secondary | ICD-10-CM | POA: Diagnosis not present

## 2024-07-28 DIAGNOSIS — E559 Vitamin D deficiency, unspecified: Secondary | ICD-10-CM | POA: Diagnosis not present

## 2024-07-28 DIAGNOSIS — R030 Elevated blood-pressure reading, without diagnosis of hypertension: Secondary | ICD-10-CM | POA: Diagnosis not present

## 2024-07-28 DIAGNOSIS — Z23 Encounter for immunization: Secondary | ICD-10-CM | POA: Diagnosis not present

## 2024-07-28 DIAGNOSIS — E7800 Pure hypercholesterolemia, unspecified: Secondary | ICD-10-CM | POA: Diagnosis not present

## 2024-07-28 DIAGNOSIS — J45998 Other asthma: Secondary | ICD-10-CM | POA: Diagnosis not present

## 2024-08-12 LAB — GENECONNECT MOLECULAR SCREEN: Genetic Analysis Overall Interpretation: NEGATIVE

## 2024-08-18 DIAGNOSIS — Z809 Family history of malignant neoplasm, unspecified: Secondary | ICD-10-CM | POA: Diagnosis not present

## 2024-11-13 ENCOUNTER — Ambulatory Visit: Admitting: Neurology
